# Patient Record
Sex: Female | Born: 1975 | Race: Black or African American | Hispanic: No | Marital: Married | Smoking: Never smoker
Health system: Southern US, Community
[De-identification: ages and names within clinical notes are randomized; demographics above are authoritative.]

## PROBLEM LIST (undated history)

## (undated) DIAGNOSIS — Z87442 Personal history of urinary calculi: Secondary | ICD-10-CM

## (undated) DIAGNOSIS — O26899 Other specified pregnancy related conditions, unspecified trimester: Secondary | ICD-10-CM

## (undated) DIAGNOSIS — D649 Anemia, unspecified: Secondary | ICD-10-CM

## (undated) DIAGNOSIS — R0602 Shortness of breath: Secondary | ICD-10-CM

## (undated) DIAGNOSIS — E785 Hyperlipidemia, unspecified: Secondary | ICD-10-CM

## (undated) DIAGNOSIS — R51 Headache: Secondary | ICD-10-CM

## (undated) DIAGNOSIS — R12 Heartburn: Secondary | ICD-10-CM

## (undated) DIAGNOSIS — G43909 Migraine, unspecified, not intractable, without status migrainosus: Secondary | ICD-10-CM

## (undated) DIAGNOSIS — N390 Urinary tract infection, site not specified: Secondary | ICD-10-CM

## (undated) DIAGNOSIS — R3129 Other microscopic hematuria: Secondary | ICD-10-CM

## (undated) HISTORY — PX: BREAST BIOPSY: SHX20

## (undated) HISTORY — DX: Urinary tract infection, site not specified: N39.0

## (undated) HISTORY — DX: Headache: R51

## (undated) HISTORY — PX: OVARIAN CYST REMOVAL: SHX89

## (undated) HISTORY — DX: Other microscopic hematuria: R31.29

## (undated) HISTORY — PX: BREAST EXCISIONAL BIOPSY: SUR124

## (undated) HISTORY — DX: Migraine, unspecified, not intractable, without status migrainosus: G43.909

## (undated) HISTORY — DX: Hyperlipidemia, unspecified: E78.5

---

## 1998-07-23 HISTORY — PX: SALPINGOOPHORECTOMY: SHX82

## 2011-08-09 ENCOUNTER — Encounter: Payer: Self-pay | Admitting: Family

## 2011-08-09 ENCOUNTER — Ambulatory Visit (INDEPENDENT_AMBULATORY_CARE_PROVIDER_SITE_OTHER): Payer: Commercial Managed Care - PPO | Admitting: Family

## 2011-08-09 DIAGNOSIS — L218 Other seborrheic dermatitis: Secondary | ICD-10-CM

## 2011-08-09 DIAGNOSIS — Z1231 Encounter for screening mammogram for malignant neoplasm of breast: Secondary | ICD-10-CM

## 2011-08-09 DIAGNOSIS — L219 Seborrheic dermatitis, unspecified: Secondary | ICD-10-CM

## 2011-08-09 DIAGNOSIS — Z0000 Encounter for general adult medical examination without abnormal findings: Secondary | ICD-10-CM

## 2011-08-09 DIAGNOSIS — R3129 Other microscopic hematuria: Secondary | ICD-10-CM

## 2011-08-09 LAB — CBC
MCV: 85.5 fl (ref 78.0–100.0)
RBC: 4.4 Mil/uL (ref 3.87–5.11)
RDW: 12.9 % (ref 11.5–14.6)
WBC: 7.6 10*3/uL (ref 4.5–10.5)

## 2011-08-09 LAB — BASIC METABOLIC PANEL
BUN: 13 mg/dL (ref 6–23)
Calcium: 9.2 mg/dL (ref 8.4–10.5)
Creatinine, Ser: 0.7 mg/dL (ref 0.4–1.2)

## 2011-08-09 LAB — TSH: TSH: 1.08 u[IU]/mL (ref 0.35–5.50)

## 2011-08-09 MED ORDER — KETOCONAZOLE 2 % EX SHAM: MEDICATED_SHAMPOO | CUTANEOUS | Status: DC

## 2011-08-09 NOTE — Progress Notes (Signed)
Subjective:    Patient ID: Kristi Rogers, female    DOB: [birth year], 36 y.o.   MRN: [id number]  HPI Comments: C/o intermittent headaches getting progressively worse over the last six months at time of menses. Works as a Development worker, community and having on-the-job stress. C/o nausea and photophobia with headaches. Pain relieved and headaches subsides when lying down in dark room. "Headache is in frontal lobe and nonradiating."    This is a routine physical examination for this healthy  Female. Reviewed all health maintenance protocols including mammography colonoscopy bone density and reviewed appropriate screening labs. Her immunization history was reviewed as well as her current medications and allergies refills of her chronic medications were given and the plan for yearly health maintenance was discussed all orders and referrals were made as appropriate.    Review of Systems  Constitutional: Negative.   HENT: Negative for neck pain and neck stiffness.   Eyes: Negative.   Respiratory: Negative.   Cardiovascular: Negative.   Genitourinary: Negative.   Musculoskeletal: Negative.   Skin: Negative.   Neurological: Positive for headaches. Negative for dizziness, seizures, syncope, facial asymmetry, speech difficulty, weakness, light-headedness and numbness.  Psychiatric/Behavioral: Negative.    Past Medical History  Diagnosis Date  . GERD (gastroesophageal reflux disease)   . Headache   . Hyperlipidemia   . Kidney stones   . Migraines   . UTI (lower urinary tract infection)   . Microscopic hematuria     History   Social History  . Marital Status: Married    Spouse Name: N/A    Number of Children: N/A  . Years of Education: N/A   Occupational History  . Not on file.   Social History Main Topics  . Smoking status: Never Smoker   . Smokeless tobacco: Not on file  . Alcohol Use: Yes     occassionally  . Drug Use: No  . Sexually Active: Not on file   Other Topics Concern  . Not on  file   Social History Narrative  . No narrative on file    Past Surgical History  Procedure Date  . Salpingoophorectomy 2000  . Cesarean section 2008  . Ovarian cyst removal     Family History  Problem Relation Age of Onset  . Cancer Sister     breast  . Arthritis Maternal Grandmother   . Hyperlipidemia Maternal Grandmother   . Heart disease Maternal Grandmother   . Alcohol abuse Maternal Grandfather   . Arthritis Maternal Grandfather   . Hyperlipidemia Maternal Grandfather   . Heart disease Maternal Grandfather   . Kidney disease Maternal Grandfather   . Arthritis Paternal Grandmother   . Arthritis Paternal Grandfather   . Hyperlipidemia Paternal Grandfather     No Known Allergies  No current outpatient prescriptions on file prior to visit.    BP 110/80  Ht 5' 6.5" (1.689 m)  Wt 162 lb (73.483 kg)  BMI 25.76 kg/m2chart     Objective:   Physical Exam  Constitutional: She is oriented to person, place, and time. She appears well-developed and well-nourished.  HENT:  Head: Normocephalic and atraumatic.  Eyes: Pupils are equal, round, and reactive to light.  Neck: No thyromegaly present.  Cardiovascular: Normal rate, regular rhythm, normal heart sounds and intact distal pulses.  Exam reveals no gallop and no friction rub.   No murmur heard. Pulmonary/Chest: Effort normal and breath sounds normal. No respiratory distress. She has no wheezes. She has no rales. She exhibits no tenderness.  Abdominal:  Soft. Bowel sounds are normal. She exhibits no distension. There is no tenderness. There is no rebound and no guarding.  Musculoskeletal: Normal range of motion. She exhibits no edema and no tenderness.  Lymphadenopathy:    She has no cervical adenopathy.  Neurological: She is alert and oriented to person, place, and time. She has normal reflexes.  Skin: Skin is warm and dry.          Assessment & Plan:  Assessment: CPE, Tension headaches,  hematuria-microscopic  Plan: Naproxen take with food, exercise for stress relief. Handgun safety.  Labs: Cbc, TSH, Cmp. Due to family history, mammogram ordered. Call the office if symptoms worsen or persist, recheck as scheduled and PRN.

## 2011-08-09 NOTE — Patient Instructions (Signed)
Breast Self-Exam A self breast exam may help you find changes or problems while they are still small. Do a breast self-exam:  Every month.   One week after your period (menstrual period).   On the first day of each month if you do not have periods anymore.  Look for any:  Change in breast color, size, or shape.   Dimples in your breast.   Changes in your nipples or skin.   Dry skin on your breasts or nipples.   Watery or bloody discharge from your nipples.   Feel for:  Lumps.   Thick, hard places.   Any other changes.  HOME CARE There are 3 ways to do the breast self-exam: In front of a mirror.  Lift your arms over your head and turn side to side.   Put your hands on your hips and lean down, then turn from side to side.   Bend forward and turn from side to side.  In the shower.  With soapy hands, check both breasts. Then check above and below your collarbone and your armpits.   Feel above and below your collarbone down to under your breast, and from the center of your chest to the outer edge of the armpit. Check for any lumps or hard spots.   Using the tips of your middle three fingers check your whole breast by pressing your hand over your breast in a circle or in an up and down motion.  Lying down.  Lie flat on your bed.   Put a small pillow under the breast you are going to check. On that same side, put your hand behind your head.   With your other hand, use the 3 middle fingers to feel the breast.   Move your fingers in a circle around the breast. Press firmly over all parts of the breast to feel for any lumps.  GET HELP RIGHT AWAY IF: You find any changes in your breasts so they can be checked. Document Released: 12/26/2007 Document Revised: 03/21/2011 Document Reviewed: 10/27/2008 Exit[employer] Patient Information 2012 [employer]. Exercise to Stay Healthy Exercise helps you become and stay healthy. EXERCISE IDEAS AND TIPS Choose exercises that:  You  enjoy.   Fit into your day.  You do not need to exercise really hard to be healthy. You can do exercises at a slow or medium level and stay healthy. You can:  Stretch before and after working out.   Try yoga, Pilates, or tai chi.   Lift weights.   Walk fast, swim, jog, run, climb stairs, bicycle, dance, or rollerskate.   Take aerobic classes.  Exercises that burn about 150 calories:  Running 1  miles in 15 minutes.   Playing volleyball for 45 to 60 minutes.   Washing and waxing a car for 45 to 60 minutes.   Playing touch football for 45 minutes.   Walking 1  miles in 35 minutes.   Pushing a stroller 1  miles in 30 minutes.   Playing basketball for 30 minutes.   Raking leaves for 30 minutes.   Bicycling 5 miles in 30 minutes.   Walking 2 miles in 30 minutes.   Dancing for 30 minutes.   Shoveling snow for 15 minutes.   Swimming laps for 20 minutes.   Walking up stairs for 15 minutes.   Bicycling 4 miles in 15 minutes.   Gardening for 30 to 45 minutes.   Jumping rope for 15 minutes.   Washing windows or floors   for 45 to 60 minutes.  Document Released: 08/11/2010 Document Revised: 03/21/2011 Document Reviewed: 08/11/2010 [employer] Patient Information 2012 [employer]. 

## 2011-08-24 ENCOUNTER — Ambulatory Visit
Admission: RE | Admit: 2011-08-24 | Discharge: 2011-08-24 | Disposition: A | Discharge status: Home / Self Care | Payer: Commercial Managed Care - PPO | Source: Ambulatory Visit | Attending: Family | Admitting: Family

## 2011-08-24 DIAGNOSIS — Z0000 Encounter for general adult medical examination without abnormal findings: Secondary | ICD-10-CM

## 2011-08-24 DIAGNOSIS — Z1231 Encounter for screening mammogram for malignant neoplasm of breast: Secondary | ICD-10-CM

## 2011-08-30 ENCOUNTER — Other Ambulatory Visit: Payer: Self-pay | Admitting: Family

## 2011-08-30 DIAGNOSIS — R928 Other abnormal and inconclusive findings on diagnostic imaging of breast: Secondary | ICD-10-CM

## 2011-08-31 ENCOUNTER — Other Ambulatory Visit: Payer: Self-pay | Admitting: Family

## 2011-08-31 ENCOUNTER — Telehealth: Payer: Self-pay | Admitting: *Deleted

## 2011-08-31 DIAGNOSIS — Z0000 Encounter for general adult medical examination without abnormal findings: Secondary | ICD-10-CM

## 2011-08-31 NOTE — Telephone Encounter (Signed)
Urology is asking for pt's UA to be faxed ASAP.

## 2011-08-31 NOTE — Telephone Encounter (Signed)
Kristi Rogers spoke with pt. UA was not done in office but pt has hx of hematuria and asked for a urology referral. Pt is currently being seen at urology

## 2011-09-07 ENCOUNTER — Ambulatory Visit
Admission: RE | Admit: 2011-09-07 | Discharge: 2011-09-07 | Disposition: A | Discharge status: Home / Self Care | Payer: Commercial Managed Care - PPO | Source: Ambulatory Visit | Attending: Family | Admitting: Family

## 2011-09-07 DIAGNOSIS — R928 Other abnormal and inconclusive findings on diagnostic imaging of breast: Secondary | ICD-10-CM

## 2011-09-14 ENCOUNTER — Other Ambulatory Visit: Payer: Commercial Managed Care - PPO

## 2011-09-28 ENCOUNTER — Other Ambulatory Visit (INDEPENDENT_AMBULATORY_CARE_PROVIDER_SITE_OTHER): Payer: Commercial Managed Care - PPO

## 2011-09-28 DIAGNOSIS — Z0000 Encounter for general adult medical examination without abnormal findings: Secondary | ICD-10-CM

## 2011-09-28 LAB — LIPID PANEL
Cholesterol: 203 mg/dL — ABNORMAL HIGH (ref 0–200)
HDL: 61.2 mg/dL (ref >39.00)
Triglycerides: 96 mg/dL (ref 0.0–149.0)
VLDL: 19.2 mg/dL (ref 0.0–40.0)

## 2011-11-07 ENCOUNTER — Emergency Department (HOSPITAL_COMMUNITY)
Admission: EM | Admit: 2011-11-07 | Discharge: 2011-11-08 | Disposition: A | Discharge status: Home / Self Care | Payer: Commercial Managed Care - PPO | Attending: Emergency Medicine | Admitting: Emergency Medicine

## 2011-11-07 ENCOUNTER — Encounter (HOSPITAL_COMMUNITY): Payer: Self-pay | Admitting: Emergency Medicine

## 2011-11-07 ENCOUNTER — Emergency Department (HOSPITAL_COMMUNITY): Payer: Commercial Managed Care - PPO

## 2011-11-07 DIAGNOSIS — R1032 Left lower quadrant pain: Secondary | ICD-10-CM | POA: Insufficient documentation

## 2011-11-07 DIAGNOSIS — N200 Calculus of kidney: Secondary | ICD-10-CM | POA: Insufficient documentation

## 2011-11-07 DIAGNOSIS — E785 Hyperlipidemia, unspecified: Secondary | ICD-10-CM | POA: Insufficient documentation

## 2011-11-07 DIAGNOSIS — N2889 Other specified disorders of kidney and ureter: Secondary | ICD-10-CM

## 2011-11-07 DIAGNOSIS — R1031 Right lower quadrant pain: Secondary | ICD-10-CM | POA: Insufficient documentation

## 2011-11-07 DIAGNOSIS — R110 Nausea: Secondary | ICD-10-CM | POA: Insufficient documentation

## 2011-11-07 DIAGNOSIS — K219 Gastro-esophageal reflux disease without esophagitis: Secondary | ICD-10-CM | POA: Insufficient documentation

## 2011-11-07 LAB — DIFFERENTIAL
Basophils Absolute: 0 10*3/uL (ref 0.0–0.1)
Eosinophils Absolute: 0 10*3/uL (ref 0.0–0.7)
Eosinophils Relative: 0 % (ref 0–5)
Lymphocytes Relative: 12 % (ref 12–46)
Monocytes Absolute: 0.5 10*3/uL (ref 0.1–1.0)

## 2011-11-07 LAB — COMPREHENSIVE METABOLIC PANEL
ALT: 13 U/L (ref 0–35)
AST: 17 U/L (ref 0–37)
CO2: 24 mEq/L (ref 19–32)
Calcium: 9.9 mg/dL (ref 8.4–10.5)
Creatinine, Ser: 0.68 mg/dL (ref 0.50–1.10)
GFR calc Af Amer: >90 mL/min (ref >90)
GFR calc non Af Amer: >90 mL/min (ref >90)
Glucose, Bld: 107 mg/dL — ABNORMAL HIGH (ref 70–99)
Sodium: 136 mEq/L (ref 135–145)
Total Protein: 7.5 g/dL (ref 6.0–8.3)

## 2011-11-07 LAB — URINALYSIS, ROUTINE W REFLEX MICROSCOPIC
Nitrite: NEGATIVE
Specific Gravity, Urine: 1.017 (ref 1.005–1.030)
Urobilinogen, UA: 1 mg/dL (ref 0.0–1.0)
pH: 7.5 (ref 5.0–8.0)

## 2011-11-07 LAB — CBC
HCT: 36 % (ref 36.0–46.0)
MCH: 28.3 pg (ref 26.0–34.0)
MCHC: 34.2 g/dL (ref 30.0–36.0)
MCV: 82.9 fL (ref 78.0–100.0)
Platelets: 266 10*3/uL (ref 150–400)
RDW: 12.9 % (ref 11.5–15.5)
WBC: 13.2 10*3/uL — ABNORMAL HIGH (ref 4.0–10.5)

## 2011-11-07 LAB — PREGNANCY, URINE: Preg Test, Ur: NEGATIVE

## 2011-11-07 LAB — URINE MICROSCOPIC-ADD ON

## 2011-11-07 MED ORDER — IOHEXOL 300 MG/ML  SOLN
80.0000 mL | Freq: Once | INTRAMUSCULAR | Status: AC | PRN
  Administered 2011-11-07: 80 mL via INTRAVENOUS

## 2011-11-07 MED ORDER — CIPROFLOXACIN HCL 500 MG PO TABS: 500.0000 mg | ORAL_TABLET | Freq: Two times a day (BID) | ORAL | Status: AC

## 2011-11-07 NOTE — ED Notes (Signed)
Pt c/o abdominal pain in RLQ and LLQ that radiates to lower back and nausea x1. Pain started in left lower abdomen and now radiates to LLQ.  Pt denies vomiting and diarrhea.  Pt able to tolerate foods and fluids.  Pt has hx of kidneys stones and ovarian torsion.

## 2011-11-07 NOTE — ED Provider Notes (Signed)
History     CSN: [id number]  Arrival date & time 11/07/11  1514   First MD Initiated Contact with Patient 11/07/11 2001      Chief Complaint  Patient presents with  . Abdominal Pain    (Consider location/radiation/quality/duration/timing/severity/associated sxs/prior treatment) HPI Comments: Patient reports she began having LLQ pain yesterday that felt like a "spasm," then noticed some urgency with urination.  The pain became sharp and then began to spread throughout her lower abdomen.  Now has dull ache.  Pain was initially 8/10, now 5/10 with laying flat.  Has associated nausea and increased number of bowel movements today.  Denies fevers, vomiting, dysuria, urinary frequency, abnormal vaginal bleeding or discharge, diarrhea, bloody stool.  Pt is s/p left salpingoophorectomy (for torsion) and right ovarian cystectomy.    Patient is a 36 y.o. female presenting with abdominal pain. The history is provided by the patient.  Abdominal Pain The primary symptoms of the illness include abdominal pain and nausea. The primary symptoms of the illness do not include fever, fatigue, vomiting, diarrhea, hematemesis, hematochezia, dysuria, vaginal discharge or vaginal bleeding.  Additional symptoms associated with the illness include urgency. Symptoms associated with the illness do not include constipation or frequency.    Past Medical History  Diagnosis Date  . GERD (gastroesophageal reflux disease)   . Headache   . Hyperlipidemia   . Kidney stones   . Migraines   . UTI (lower urinary tract infection)   . Microscopic hematuria     Past Surgical History  Procedure Date  . Salpingoophorectomy 2000  . Cesarean section 2008  . Ovarian cyst removal     Family History  Problem Relation Age of Onset  . Cancer Sister     breast  . Arthritis Maternal Grandmother   . Hyperlipidemia Maternal Grandmother   . Heart disease Maternal Grandmother   . Alcohol abuse Maternal Grandfather   .  Arthritis Maternal Grandfather   . Hyperlipidemia Maternal Grandfather   . Heart disease Maternal Grandfather   . Kidney disease Maternal Grandfather   . Arthritis Paternal Grandmother   . Arthritis Paternal Grandfather   . Hyperlipidemia Paternal Grandfather     History  Substance Use Topics  . Smoking status: Never Smoker   . Smokeless tobacco: Not on file  . Alcohol Use: Yes     occassionally    OB History    Grav Para Term Preterm Abortions TAB SAB Ect Mult Living                  Review of Systems  Constitutional: Negative for fever and fatigue.  Gastrointestinal: Positive for nausea and abdominal pain. Negative for vomiting, diarrhea, constipation, blood in stool, hematochezia, anal bleeding, rectal pain and hematemesis.  Genitourinary: Positive for urgency. Negative for dysuria, frequency, vaginal bleeding and vaginal discharge.  All other systems reviewed and are negative.    Allergies  Review of patient's allergies indicates no known allergies.  Home Medications   Current Outpatient Rx  Name Route Sig Dispense Refill  . CETIRIZINE HCL 10 MG PO TABS Oral Take 10 mg by mouth daily.    Marland Kitchen VITAMIN D 1000 UNITS PO TABS Oral Take 1,000 Units by mouth daily.    . OMEGA-3 FATTY ACIDS 1000 MG PO CAPS Oral Take 2 g by mouth daily.    Marland Kitchen OVER THE COUNTER MEDICATION Oral Take 1 tablet by mouth every 6 (six) hours as needed. Tylenol Migraine with caffeine.  For migraines.  BP 109/61  Pulse 80  Temp(Src) 97.8 F (36.6 C) (Oral)  Resp 22  SpO2 99%  LMP 10/24/2011  Physical Exam  Nursing note and vitals reviewed. Constitutional: She is oriented to person, place, and time. She appears well-developed and well-nourished. No distress.  HENT:  Head: Normocephalic and atraumatic.  Neck: Neck supple.  Cardiovascular: Normal rate, regular rhythm and normal heart sounds.   Pulmonary/Chest: Breath sounds normal. No respiratory distress. She has no wheezes. She has no  rales. She exhibits no tenderness.  Abdominal: Soft. Bowel sounds are normal. She exhibits no distension and no mass. There is tenderness in the right lower quadrant. There is no rigidity, no rebound, no guarding and no CVA tenderness.       Patient tender across lower abdomen, greatest on R.  Neurological: She is alert and oriented to person, place, and time.  Skin: She is not diaphoretic.  Psychiatric: She has a normal mood and affect. Her behavior is normal. Judgment and thought content normal.    ED Course  Procedures (including critical care time)  Labs Reviewed  CBC - Abnormal; Notable for the following:    WBC 13.2 (*)    All other components within normal limits  DIFFERENTIAL - Abnormal; Notable for the following:    Neutrophils Relative 84 (*)    Neutro Abs 11.1 (*)    All other components within normal limits  COMPREHENSIVE METABOLIC PANEL - Abnormal; Notable for the following:    Glucose, Bld 107 (*)    All other components within normal limits  URINALYSIS, ROUTINE W REFLEX MICROSCOPIC - Abnormal; Notable for the following:    APPearance CLOUDY (*)    Hgb urine dipstick LARGE (*)    All other components within normal limits  PREGNANCY, URINE  URINE MICROSCOPIC-ADD ON  URINE CULTURE   Ct Abdomen Pelvis W Contrast  11/07/2011  *RADIOLOGY REPORT*  Clinical Data: Right lower quadrant and left lower quadrant abdominal pain, radiating to the lower back.  Nausea.  CT ABDOMEN AND PELVIS WITH CONTRAST  Technique:  Multidetector CT imaging of the abdomen and pelvis was performed following the standard protocol during bolus administration of intravenous contrast.  Contrast: 80mL OMNIPAQUE IOHEXOL 300 MG/ML  SOLN  Comparison: CT of the abdomen and pelvis performed 09/21/2011  Findings: Trace bilateral pleural effusions are noted; the visualized lung bases are otherwise grossly clear.  The liver and spleen are unremarkable in appearance.  The gallbladder is within normal limits.  The  pancreas and adrenal glands are unremarkable.  There is mild diffuse soft tissue stranding noted along the course of the left ureter, with mild wall thickening, raising suspicion for mild left-sided ureteritis.  No significant pyelonephritis is seen.  There is no evidence of hydronephrosis.  No obstructing ureteral stones are identified.  A nonobstructing 3 mm stone is noted at the lower pole of the right kidney.  A 2.0 cm cyst is noted near the upper pole of the right kidney.  The left kidney is grossly unremarkable in appearance.  No significant perinephric stranding is appreciated.  No free fluid is identified.  The small bowel is unremarkable in appearance.  The stomach is within normal limits.  No acute vascular abnormalities are seen.  The appendix is normal in caliber, without evidence for appendicitis.  The colon is unremarkable in appearance.  The bladder is moderately distended and grossly unremarkable in appearance.  The uterus is within normal limits.  The left ovary has been removed; the right ovary  is unremarkable in appearance. No suspicious adnexal masses are seen.  No inguinal lymphadenopathy is seen.  No acute osseous abnormalities are identified.  IMPRESSION:  1.  Mild diffuse soft tissue stranding along the course of the left ureter, with mild wall thickening, raising suspicion for mild left- sided ureteritis. 2.  No evidence of hydronephrosis; no obstructing ureteral stones seen. 3.  Nonobstructing 3 mm stone at the lower pole of the right kidney. 4.  Right renal cyst seen. 5.  Trace bilateral pleural effusion; lung bases otherwise grossly clear.  Original Report Authenticated By: Tonia Ghent, M.D.    11:19 PM I discussed the results with Dr Radford Pax who suggested I discuss the CT with Dr Cherly Hensen who read the CT scan.  Patient found to have left ureteritis but UA appears to be without infection.  Per Dr Cherly Hensen, most ureteritis is infectious, though may be viral or bacterial.  Patient has known  chronic hematuria.  I discussed this with Dr Radford Pax - plan is for urine culture, d/c home with antibiotics and urology follow up.   11:27 PM Discussed results and plan with patient.  Pt declines pain and nausea medications for home.    1. Ureteritis       MDM  Patient with LLQ pain that began yesterday with associated urinary urgency.  Pt found to have left ureteritis.  UA does not appear to be infected, sent for culture.  Given discussion with Dr Cherly Hensen and Dr Radford Pax, pt d/c home with antibiotics, urology follow up.  Patient verbalizes understanding and agrees with plan.          Dillard Cannon [place], [place] 11/07/11 2329

## 2011-11-07 NOTE — Discharge Instructions (Signed)
Please call [facility] tomorrow morning to schedule a follow up appointment.  If you develop uncontrolled pain, nausea and vomiting, fevers greater than 100.4, or difficulty urinating, return to the ER immediately for a recheck.  You may return to the ER at any time for worsening condition or any new symptoms that concern you.

## 2011-11-07 NOTE — ED Notes (Signed)
Urine collected and placed at bedside.  

## 2011-11-09 NOTE — ED Provider Notes (Signed)
Medical screening examination/treatment/procedure(s) were performed by non-physician practitioner and as supervising physician I was immediately available for consultation/collaboration.    Nelia Shi, MD 11/09/11 2036

## 2012-05-08 LAB — OB RESULTS CONSOLE ABO/RH: RH Type: POSITIVE

## 2012-05-08 LAB — OB RESULTS CONSOLE ANTIBODY SCREEN: Antibody Screen: NEGATIVE

## 2012-05-08 LAB — OB RESULTS CONSOLE HEPATITIS B SURFACE ANTIGEN: Hepatitis B Surface Ag: NEGATIVE

## 2012-11-27 ENCOUNTER — Encounter (HOSPITAL_COMMUNITY): Payer: Self-pay | Admitting: Pharmacy Technician

## 2012-12-05 ENCOUNTER — Encounter (HOSPITAL_COMMUNITY): Payer: Self-pay

## 2012-12-08 ENCOUNTER — Encounter (HOSPITAL_COMMUNITY)
Admission: RE | Admit: 2012-12-08 | Discharge: 2012-12-08 | Disposition: A | Discharge status: Home / Self Care | Payer: Commercial Managed Care - PPO | Source: Ambulatory Visit | Attending: Obstetrics & Gynecology | Admitting: Obstetrics & Gynecology

## 2012-12-08 ENCOUNTER — Encounter (HOSPITAL_COMMUNITY): Payer: Self-pay

## 2012-12-08 HISTORY — DX: Shortness of breath: R06.02

## 2012-12-08 HISTORY — DX: Personal history of urinary calculi: Z87.442

## 2012-12-08 HISTORY — DX: Heartburn: R12

## 2012-12-08 HISTORY — DX: Anemia, unspecified: D64.9

## 2012-12-08 HISTORY — DX: Other specified pregnancy related conditions, unspecified trimester: O26.899

## 2012-12-08 LAB — CBC
Platelets: 127 10*3/uL — ABNORMAL LOW (ref 150–400)
RBC: 3.61 MIL/uL — ABNORMAL LOW (ref 3.87–5.11)
RDW: 15.6 % — ABNORMAL HIGH (ref 11.5–15.5)
WBC: 8.3 10*3/uL (ref 4.0–10.5)

## 2012-12-08 LAB — TYPE AND SCREEN
ABO/RH(D): A POS
Antibody Screen: NEGATIVE

## 2012-12-08 LAB — RPR: RPR Ser Ql: NONREACTIVE

## 2012-12-08 NOTE — Pre-Procedure Instructions (Signed)
Dr. Sherron Ales notified of pt's platelets results, 127.

## 2012-12-08 NOTE — Patient Instructions (Signed)
Your procedure is scheduled on:12/10/12  Enter through the Main Entrance at :7am Pick up desk phone and dial [phone] and inform us of your arrival.  Please call [phone] if you have any problems the morning of surgery.  Remember: Do not eat or drink after midnight:Tuesday    DO NOT wear jewelry, eye make-up, lipstick,body lotion, or dark fingernail polish.  If you are to be admitted after surgery, leave suitcase in car until your room has been assigned. Patients discharged on the day of surgery will not be allowed to drive home.

## 2012-12-09 NOTE — H&P (Signed)
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  Antepartum course is complicated by AMA with negative MaterniT21.  GBS negative. Maternal Medical History:  Contractions: Onset was 1 week ago.   Frequency: rare.   Perceived severity is mild.    Fetal activity: Perceived fetal activity is normal.   Last perceived fetal movement was within the past hour.    Prenatal complications: no prenatal complications Prenatal Complications - Diabetes: none.    OB History   Grav Para Term Preterm Abortions TAB SAB Ect Mult Living   2 1 1       1      Past Medical History  Diagnosis Date  . Headache   . Hyperlipidemia   . Migraines   . UTI (lower urinary tract infection)   . Microscopic hematuria   . History of kidney stones   . Heartburn in pregnancy   . Shortness of breath     with pregnancy wt gain  . Anemia    Past Surgical History  Procedure Laterality Date  . Salpingoophorectomy  2000  . Cesarean section  2008  . Ovarian cyst removal     Family History: family history includes Alcohol abuse in her maternal grandfather; Arthritis in her maternal grandfather, maternal grandmother, paternal grandfather, and paternal grandmother; Cancer in her sister; Heart disease in her maternal grandfather and maternal grandmother; Hyperlipidemia in her maternal grandfather, maternal grandmother, and paternal grandfather; and Kidney disease in her maternal grandfather. Social History:  reports that she has never smoked. She does not have any smokeless tobacco history on file. She reports that  drinks alcohol. She reports that she does not use illicit drugs.   Prenatal Transfer Tool  Maternal Diabetes: No Genetic Screening: Normal Maternal Ultrasounds/Referrals: Normal Fetal Ultrasounds or other Referrals:  None Maternal Substance Abuse:  No Significant Maternal Medications:  None Significant Maternal Lab Results:  None Other Comments:  None  ROS    Last menstrual  period 03/12/2012. Maternal Exam:  Uterine Assessment: Contraction strength is mild.  Contraction frequency is rare.   Abdomen: Patient reports no abdominal tenderness. Surgical scars: low transverse.   Fundal height is c/w dates.   Estimated fetal weight is 7#6.   Fetal presentation: vertex     Physical Exam  Constitutional: She is oriented to person, place, and time. She appears well-developed and well-nourished.  Cardiovascular: Normal rate and regular rhythm.   Respiratory: Effort normal and breath sounds normal.  GI: Soft. There is no rebound and no guarding.  Neurological: She is alert and oriented to person, place, and time.  Skin: Skin is warm and dry.  Psychiatric: She has a normal mood and affect. Her behavior is normal.    Prenatal labs: ABO, Rh: --/--/A POS (05/19 1059) Antibody: NEG (05/19 1059) Rubella: Immune (10/17 0000) RPR: NON REACTIVE (05/19 1059)  HBsAg: Negative (10/17 0000)  HIV: Non-reactive (10/17 0000)  GBS:     Assessment/Plan: 36yo G2P1001 at 39 weeks with h/o prior C/S, desires rpt -Plan for Rpt C/S  Kristi Rogers 12/09/2012, 8:35 PM

## 2012-12-10 ENCOUNTER — Encounter (HOSPITAL_COMMUNITY): Payer: Self-pay | Admitting: *Deleted

## 2012-12-10 ENCOUNTER — Encounter (HOSPITAL_COMMUNITY): Payer: Self-pay | Admitting: Anesthesiology

## 2012-12-10 ENCOUNTER — Inpatient Hospital Stay (HOSPITAL_COMMUNITY): Payer: Commercial Managed Care - PPO | Admitting: Anesthesiology

## 2012-12-10 ENCOUNTER — Inpatient Hospital Stay (HOSPITAL_COMMUNITY)
Admission: AD | Admit: 2012-12-10 | Discharge: 2012-12-12 | DRG: 766 | Disposition: A | Discharge status: Home / Self Care | Payer: Commercial Managed Care - PPO | Source: Ambulatory Visit | Attending: Obstetrics and Gynecology | Admitting: Obstetrics and Gynecology

## 2012-12-10 ENCOUNTER — Encounter (HOSPITAL_COMMUNITY)
Admission: AD | Disposition: A | Discharge status: Home / Self Care | Payer: Self-pay | Source: Ambulatory Visit | Attending: Obstetrics and Gynecology

## 2012-12-10 DIAGNOSIS — O09529 Supervision of elderly multigravida, unspecified trimester: Secondary | ICD-10-CM | POA: Diagnosis present

## 2012-12-10 DIAGNOSIS — O34219 Maternal care for unspecified type scar from previous cesarean delivery: Principal | ICD-10-CM | POA: Diagnosis present

## 2012-12-10 LAB — CBC
HCT: 31.6 % — ABNORMAL LOW (ref 36.0–46.0)
MCV: 84.9 fL (ref 78.0–100.0)
Platelets: 121 10*3/uL — ABNORMAL LOW (ref 150–400)
RBC: 3.72 MIL/uL — ABNORMAL LOW (ref 3.87–5.11)
WBC: 8.6 10*3/uL (ref 4.0–10.5)

## 2012-12-10 SURGERY — Surgical Case
Anesthesia: Spinal | Site: Abdomen | Wound class: Clean Contaminated

## 2012-12-10 MED ORDER — LACTATED RINGERS IV SOLN
INTRAVENOUS | Status: DC
  Administered 2012-12-10 – 2012-12-11 (×2): via INTRAVENOUS

## 2012-12-10 MED ORDER — LACTATED RINGERS IV SOLN
INTRAVENOUS | Status: DC
  Administered 2012-12-10 (×3): via INTRAVENOUS

## 2012-12-10 MED ORDER — PHENYLEPHRINE HCL 10 MG/ML IJ SOLN
INTRAMUSCULAR | Status: DC | PRN
  Administered 2012-12-10: 40 ug via INTRAVENOUS
  Administered 2012-12-10: 80 ug via INTRAVENOUS
  Administered 2012-12-10: 40 ug via INTRAVENOUS
  Administered 2012-12-10 (×3): 80 ug via INTRAVENOUS

## 2012-12-10 MED ORDER — KETOROLAC TROMETHAMINE 30 MG/ML IJ SOLN
30.0000 mg | Freq: Four times a day (QID) | INTRAMUSCULAR | Status: AC | PRN
  Administered 2012-12-10 (×2): 30 mg via INTRAVENOUS
  Filled 2012-12-10 (×2): qty 1

## 2012-12-10 MED ORDER — SODIUM CHLORIDE 0.9 % IJ SOLN: 3.0000 mL | INTRAMUSCULAR | Status: DC | PRN

## 2012-12-10 MED ORDER — BUPIVACAINE IN DEXTROSE 0.75-8.25 % IT SOLN
INTRATHECAL | Status: DC | PRN
  Administered 2012-12-10: 1.8 mL via INTRATHECAL

## 2012-12-10 MED ORDER — OXYTOCIN 10 UNIT/ML IJ SOLN
INTRAMUSCULAR | Status: AC
  Filled 2012-12-10: qty 4

## 2012-12-10 MED ORDER — DIPHENHYDRAMINE HCL 50 MG/ML IJ SOLN: 25.0000 mg | INTRAMUSCULAR | Status: DC | PRN

## 2012-12-10 MED ORDER — ONDANSETRON HCL 4 MG/2ML IJ SOLN
INTRAMUSCULAR | Status: DC | PRN
  Administered 2012-12-10: 4 mg via INTRAVENOUS

## 2012-12-10 MED ORDER — NALOXONE HCL 0.4 MG/ML IJ SOLN: 0.4000 mg | INTRAMUSCULAR | Status: DC | PRN

## 2012-12-10 MED ORDER — SCOPOLAMINE 1 MG/3DAYS TD PT72
MEDICATED_PATCH | TRANSDERMAL | Status: AC
  Administered 2012-12-10: 1.5 mg via TRANSDERMAL
  Filled 2012-12-10: qty 1

## 2012-12-10 MED ORDER — DEXTROSE IN LACTATED RINGERS 5 % IV SOLN: INTRAVENOUS | Status: DC

## 2012-12-10 MED ORDER — OXYTOCIN 10 UNIT/ML IJ SOLN
40.0000 [IU] | INTRAVENOUS | Status: DC | PRN
  Administered 2012-12-10: 40 [IU] via INTRAVENOUS

## 2012-12-10 MED ORDER — OXYTOCIN 40 UNITS IN LACTATED RINGERS INFUSION - SIMPLE MED: 62.5000 mL/h | INTRAVENOUS | Status: AC

## 2012-12-10 MED ORDER — IBUPROFEN 600 MG PO TABS
600.0000 mg | ORAL_TABLET | Freq: Four times a day (QID) | ORAL | Status: DC
  Administered 2012-12-10 – 2012-12-12 (×6): 600 mg via ORAL
  Filled 2012-12-10 (×6): qty 1

## 2012-12-10 MED ORDER — MENTHOL 3 MG MT LOZG: 1.0000 | LOZENGE | OROMUCOSAL | Status: DC | PRN

## 2012-12-10 MED ORDER — SENNOSIDES-DOCUSATE SODIUM 8.6-50 MG PO TABS
2.0000 | ORAL_TABLET | Freq: Every day | ORAL | Status: DC
  Administered 2012-12-10 – 2012-12-11 (×2): 2 via ORAL

## 2012-12-10 MED ORDER — PHENYLEPHRINE 40 MCG/ML (10ML) SYRINGE FOR IV PUSH (FOR BLOOD PRESSURE SUPPORT)
PREFILLED_SYRINGE | INTRAVENOUS | Status: AC
  Filled 2012-12-10: qty 10

## 2012-12-10 MED ORDER — SIMETHICONE 80 MG PO CHEW
80.0000 mg | CHEWABLE_TABLET | Freq: Three times a day (TID) | ORAL | Status: DC
Start: 2012-12-10 — End: 2012-12-12
  Administered 2012-12-10 – 2012-12-12 (×5): 80 mg via ORAL

## 2012-12-10 MED ORDER — NALBUPHINE SYRINGE 5 MG/0.5 ML
5.0000 mg | INJECTION | INTRAMUSCULAR | Status: DC | PRN
  Filled 2012-12-10: qty 1

## 2012-12-10 MED ORDER — FERROUS SULFATE 325 (65 FE) MG PO TABS
325.0000 mg | ORAL_TABLET | Freq: Every day | ORAL | Status: DC
  Administered 2012-12-11 – 2012-12-12 (×2): 325 mg via ORAL
  Filled 2012-12-10 (×2): qty 1

## 2012-12-10 MED ORDER — OXYCODONE-ACETAMINOPHEN 5-325 MG PO TABS: 1.0000 | ORAL_TABLET | ORAL | Status: DC | PRN

## 2012-12-10 MED ORDER — PRENATAL MULTIVITAMIN CH
1.0000 | ORAL_TABLET | Freq: Every day | ORAL | Status: DC
  Administered 2012-12-11: 1 via ORAL

## 2012-12-10 MED ORDER — ONDANSETRON HCL 4 MG/2ML IJ SOLN: 4.0000 mg | INTRAMUSCULAR | Status: DC | PRN

## 2012-12-10 MED ORDER — WITCH HAZEL-GLYCERIN EX PADS: 1.0000 "application " | MEDICATED_PAD | CUTANEOUS | Status: DC | PRN

## 2012-12-10 MED ORDER — NALOXONE HCL 1 MG/ML IJ SOLN
1.0000 ug/kg/h | INTRAMUSCULAR | Status: DC | PRN
  Filled 2012-12-10: qty 2

## 2012-12-10 MED ORDER — LANOLIN HYDROUS EX OINT: 1.0000 "application " | TOPICAL_OINTMENT | CUTANEOUS | Status: DC | PRN

## 2012-12-10 MED ORDER — ONDANSETRON HCL 4 MG/2ML IJ SOLN: 4.0000 mg | Freq: Three times a day (TID) | INTRAMUSCULAR | Status: DC | PRN

## 2012-12-10 MED ORDER — CEFAZOLIN SODIUM-DEXTROSE 2-3 GM-% IV SOLR
INTRAVENOUS | Status: AC
  Filled 2012-12-10: qty 50

## 2012-12-10 MED ORDER — LACTATED RINGERS IV SOLN
INTRAVENOUS | Status: DC | PRN
  Administered 2012-12-10: 09:00:00 via INTRAVENOUS

## 2012-12-10 MED ORDER — DIPHENHYDRAMINE HCL 25 MG PO CAPS: 25.0000 mg | ORAL_CAPSULE | Freq: Four times a day (QID) | ORAL | Status: DC | PRN

## 2012-12-10 MED ORDER — FENTANYL CITRATE 0.05 MG/ML IJ SOLN
INTRAMUSCULAR | Status: AC
  Filled 2012-12-10: qty 2

## 2012-12-10 MED ORDER — SCOPOLAMINE 1 MG/3DAYS TD PT72: 1.0000 | MEDICATED_PATCH | Freq: Once | TRANSDERMAL | Status: DC

## 2012-12-10 MED ORDER — ZOLPIDEM TARTRATE 5 MG PO TABS: 5.0000 mg | ORAL_TABLET | Freq: Every evening | ORAL | Status: DC | PRN

## 2012-12-10 MED ORDER — PHENYLEPHRINE 40 MCG/ML (10ML) SYRINGE FOR IV PUSH (FOR BLOOD PRESSURE SUPPORT)
PREFILLED_SYRINGE | INTRAVENOUS | Status: AC
  Filled 2012-12-10: qty 5

## 2012-12-10 MED ORDER — CEFAZOLIN SODIUM-DEXTROSE 2-3 GM-% IV SOLR
2.0000 g | INTRAVENOUS | Status: AC
  Administered 2012-12-10: 2 g via INTRAVENOUS

## 2012-12-10 MED ORDER — ACETAMINOPHEN 10 MG/ML IV SOLN
1000.0000 mg | Freq: Four times a day (QID) | INTRAVENOUS | Status: AC | PRN
  Filled 2012-12-10: qty 100

## 2012-12-10 MED ORDER — DIBUCAINE 1 % RE OINT: 1.0000 "application " | TOPICAL_OINTMENT | RECTAL | Status: DC | PRN

## 2012-12-10 MED ORDER — NALBUPHINE HCL 10 MG/ML IJ SOLN: 5.0000 mg | INTRAMUSCULAR | Status: DC | PRN

## 2012-12-10 MED ORDER — NALOXONE HCL 1 MG/ML IJ SOLN: 1.0000 ug/kg/h | INTRAVENOUS | Status: DC | PRN

## 2012-12-10 MED ORDER — EPHEDRINE SULFATE 50 MG/ML IJ SOLN
INTRAMUSCULAR | Status: DC | PRN
  Administered 2012-12-10: 10 mg via INTRAVENOUS
  Administered 2012-12-10 (×2): 5 mg via INTRAVENOUS
  Administered 2012-12-10: 10 mg via INTRAVENOUS

## 2012-12-10 MED ORDER — TETANUS-DIPHTH-ACELL PERTUSSIS 5-2.5-18.5 LF-MCG/0.5 IM SUSP
0.5000 mL | Freq: Once | INTRAMUSCULAR | Status: DC
  Filled 2012-12-10: qty 0.5

## 2012-12-10 MED ORDER — MEPERIDINE HCL 25 MG/ML IJ SOLN: 6.2500 mg | INTRAMUSCULAR | Status: DC | PRN

## 2012-12-10 MED ORDER — EPHEDRINE 5 MG/ML INJ
INTRAVENOUS | Status: AC
  Filled 2012-12-10: qty 10

## 2012-12-10 MED ORDER — METOCLOPRAMIDE HCL 5 MG/ML IJ SOLN: 10.0000 mg | Freq: Three times a day (TID) | INTRAMUSCULAR | Status: DC | PRN

## 2012-12-10 MED ORDER — DIPHENHYDRAMINE HCL 50 MG/ML IJ SOLN: 12.5000 mg | INTRAMUSCULAR | Status: DC | PRN

## 2012-12-10 MED ORDER — SCOPOLAMINE 1 MG/3DAYS TD PT72
1.0000 | MEDICATED_PATCH | TRANSDERMAL | Status: DC
Start: 2012-12-10 — End: 2012-12-10

## 2012-12-10 MED ORDER — FENTANYL CITRATE 0.05 MG/ML IJ SOLN: 25.0000 ug | INTRAMUSCULAR | Status: DC | PRN

## 2012-12-10 MED ORDER — ONDANSETRON HCL 4 MG/2ML IJ SOLN
INTRAMUSCULAR | Status: AC
  Filled 2012-12-10: qty 2

## 2012-12-10 MED ORDER — METOCLOPRAMIDE HCL 5 MG/ML IJ SOLN: 10.0000 mg | Freq: Once | INTRAMUSCULAR | Status: DC | PRN

## 2012-12-10 MED ORDER — SIMETHICONE 80 MG PO CHEW
80.0000 mg | CHEWABLE_TABLET | ORAL | Status: DC | PRN
  Administered 2012-12-11: 80 mg via ORAL

## 2012-12-10 MED ORDER — DIPHENHYDRAMINE HCL 25 MG PO CAPS
25.0000 mg | ORAL_CAPSULE | ORAL | Status: DC | PRN
  Filled 2012-12-10: qty 1

## 2012-12-10 MED ORDER — DIPHENHYDRAMINE HCL 25 MG PO CAPS: 25.0000 mg | ORAL_CAPSULE | ORAL | Status: DC | PRN

## 2012-12-10 MED ORDER — KETOROLAC TROMETHAMINE 30 MG/ML IJ SOLN: 30.0000 mg | Freq: Four times a day (QID) | INTRAMUSCULAR | Status: AC | PRN

## 2012-12-10 MED ORDER — MORPHINE SULFATE 0.5 MG/ML IJ SOLN
INTRAMUSCULAR | Status: AC
  Filled 2012-12-10: qty 10

## 2012-12-10 MED ORDER — FENTANYL CITRATE 0.05 MG/ML IJ SOLN
INTRAMUSCULAR | Status: DC | PRN
  Administered 2012-12-10: 25 ug via INTRATHECAL

## 2012-12-10 MED ORDER — MORPHINE SULFATE (PF) 0.5 MG/ML IJ SOLN
INTRAMUSCULAR | Status: DC | PRN
  Administered 2012-12-10: .15 mg via INTRATHECAL

## 2012-12-10 MED ORDER — ONDANSETRON HCL 4 MG PO TABS: 4.0000 mg | ORAL_TABLET | ORAL | Status: DC | PRN

## 2012-12-10 SURGICAL SUPPLY — 31 items
CLOTH BEACON ORANGE TIMEOUT ST (SAFETY) ×2 IMPLANT
DERMABOND ADVANCED (GAUZE/BANDAGES/DRESSINGS)
DERMABOND ADVANCED .7 DNX12 (GAUZE/BANDAGES/DRESSINGS) IMPLANT
DRAPE LG THREE QUARTER DISP (DRAPES) ×2 IMPLANT
DRESSING TELFA 8X3 (GAUZE/BANDAGES/DRESSINGS) ×2 IMPLANT
DRSG OPSITE POSTOP 4X10 (GAUZE/BANDAGES/DRESSINGS) ×2 IMPLANT
DURAPREP 26ML APPLICATOR (WOUND CARE) ×2 IMPLANT
ELECT REM PT RETURN 9FT ADLT (ELECTROSURGICAL) ×2
ELECTRODE REM PT RTRN 9FT ADLT (ELECTROSURGICAL) ×1 IMPLANT
EXTRACTOR VACUUM M CUP 4 TUBE (SUCTIONS) IMPLANT
GLOVE BIO SURGEON STRL SZ 6 (GLOVE) ×2 IMPLANT
GLOVE BIOGEL PI IND STRL 6 (GLOVE) ×2 IMPLANT
GLOVE BIOGEL PI INDICATOR 6 (GLOVE) ×2
GOWN STRL REIN XL XLG (GOWN DISPOSABLE) ×4 IMPLANT
KIT ABG SYR 3ML LUER SLIP (SYRINGE) ×2 IMPLANT
NEEDLE HYPO 25X5/8 SAFETYGLIDE (NEEDLE) ×2 IMPLANT
NS IRRIG 1000ML POUR BTL (IV SOLUTION) ×2 IMPLANT
PACK C SECTION WH (CUSTOM PROCEDURE TRAY) ×2 IMPLANT
PAD ABD 7.5X8 STRL (GAUZE/BANDAGES/DRESSINGS) ×2 IMPLANT
PAD OB MATERNITY 4.3X12.25 (PERSONAL CARE ITEMS) ×2 IMPLANT
SPONGE GAUZE 4X4 12PLY (GAUZE/BANDAGES/DRESSINGS) ×4 IMPLANT
STAPLER VISISTAT 35W (STAPLE) ×2 IMPLANT
SUT CHROMIC 0 CTX 36 (SUTURE) ×6 IMPLANT
SUT MON AB 2-0 CT1 27 (SUTURE) ×2 IMPLANT
SUT PDS AB 0 CT1 27 (SUTURE) ×2 IMPLANT
SUT PLAIN 0 NONE (SUTURE) IMPLANT
SUT VIC AB 0 CT1 36 (SUTURE) IMPLANT
SUT VIC AB 4-0 KS 27 (SUTURE) ×2 IMPLANT
TOWEL OR 17X24 6PK STRL BLUE (TOWEL DISPOSABLE) ×6 IMPLANT
TRAY FOLEY CATH 14FR (SET/KITS/TRAYS/PACK) ×2 IMPLANT
WATER STERILE IRR 1000ML POUR (IV SOLUTION) ×2 IMPLANT

## 2012-12-10 NOTE — Progress Notes (Signed)
No change to H&P. 

## 2012-12-10 NOTE — Anesthesia Postprocedure Evaluation (Signed)
Anesthesia Post Note  Patient: Kristi Isle, MD  Procedure(s) Performed: Procedure(s) (LRB): REPEAT CESAREAN SECTION (N/A)  Anesthesia type: Spinal  Patient location: Mother/Baby  Post pain: Pain level controlled  Post assessment: Post-op Vital signs reviewed  Last Vitals:  Filed Vitals:   12/10/12 1430  BP: 109/65  Pulse: 83  Temp: 36.7 C  Resp: 16    Post vital signs: Reviewed  Level of consciousness: awake  Complications: No apparent anesthesia complications

## 2012-12-10 NOTE — Op Note (Signed)
Glennie Isle, MD PROCEDURE DATE: 12/10/2012  PREOPERATIVE DIAGNOSIS: Intrauterine pregnancy at  [redacted]w[redacted]d weeks gestation with history of prior C/S, desires repeat  POSTOPERATIVE DIAGNOSIS: The same  PROCEDURE:    Low Transverse Cesarean Section  SURGEON:  Dr. 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 [id number] at [redacted]w[redacted]d scheduled for cesarean section secondary to history of prior C/S, desire for repeat.  The risks of cesarean section discussed with the patient included but were not limited to: bleeding which may require transfusion or reoperation; infection which may require antibiotics; injury to bowel, bladder, ureters or other surrounding organs; injury to the fetus; need for additional procedures including hysterectomy in the event of a life-threatening hemorrhage; placental abnormalities wth subsequent pregnancies, incisional problems, thromboembolic phenomenon and other postoperative/anesthesia complications. The patient concurred with the proposed plan, giving informed written consent for the procedure.    FINDINGS:  Viable female infant in cephalic presentation, APGAR 8,8:  Weight pending  Clear amniotic fluid.  Intact placenta, three vessel cord.  Grossly normal uterus, right ovary and fallopian tube; surgically absent left ovary and fallopian tube. .   ANESTHESIA:   Spinal ESTIMATED BLOOD LOSS: 800 ml SPECIMENS: Placenta sent to L&D COMPLICATIONS: None immediate  PROCEDURE IN DETAIL:  The patient received intravenous antibiotics and had sequential compression devices applied to her lower extremities while in the preoperative area.  She was then taken to the operating room where spinal anesthesia was administered and was found to be adequate. She was then placed in a dorsal supine position with a leftward tilt, and prepped and draped in a sterile manner.  A foley catheter was placed into her bladder and attached to constant gravity.  After an adequate timeout was  performed, a Pfannenstiel skin incision was made with scalpel and carried through to the underlying layer of fascia. The fascia was incised in the midline and this incision was extended bilaterally using the Mayo scissors. Kocher clamps were applied to the superior aspect of the fascial incision and the underlying rectus muscles were dissected off bluntly. A similar process was carried out on the inferior aspect of the facial incision. The rectus muscles were separated in the midline bluntly and the peritoneum was entered bluntly.   A transverse hysterotomy was made with a scalpel and extended bilaterally bluntly. The bladder blade was then removed. The infant was successfully delivered, and cord was clamped and cut and infant was handed over to awaiting neonatology team. Uterine massage was then administered and the placenta delivered intact with three-vessel cord and handed off for cord blood donation. The uterus was cleared of clot and debris.  The hysterotomy was closed with #1 Chromicl.  A second imbricating suture of #1 Chromic was used to reinforce the incision and aid in hemostasis.  The peritoneum and rectus muscles were noted to be hemostatic.  The fascia was closed with PDS in a running fashion with good restoration of anatomy.  The subcutaneus tissue was copiously irrigated.  The skin was closed with staples.  Pt tolerated the procedure will.  All counts were correct x2.  Pt went to the recovery room in stable condition.

## 2012-12-10 NOTE — Transfer of Care (Signed)
Immediate Anesthesia Transfer of Care Note  Patient: Kristi Isle, MD  Procedure(s) Performed: Procedure(s) with comments: REPEAT CESAREAN SECTION (N/A) - Repeat edc 12/17/12  Patient Location: PACU  Anesthesia Type:Spinal  Level of Consciousness: awake, alert  and oriented  Airway & Oxygen Therapy: Patient Spontanous Breathing  Post-op Assessment: Report given to PACU RN and Post -op Vital signs reviewed and stable  Post vital signs: stable  Complications: No apparent anesthesia complications

## 2012-12-10 NOTE — Anesthesia Procedure Notes (Signed)
Spinal  Patient location during procedure: OR Start time: 12/10/2012 8:30 AM Staffing Anesthesiologist: Jaleiah Asay A. Performed by: anesthesiologist  Preanesthetic Checklist Completed: patient identified, site marked, surgical consent, pre-op evaluation, timeout performed, IV checked, risks and benefits discussed and monitors and equipment checked Spinal Block Patient position: sitting Prep: site prepped and draped and DuraPrep Patient monitoring: heart rate, cardiac monitor, continuous pulse ox and blood pressure Approach: midline Location: L3-4 Injection technique: single-shot Needle Needle type: Sprotte  Needle gauge: 24 G Needle length: 9 cm Needle insertion depth: 6 cm Assessment Sensory level: T4 Additional Notes Patient tolerated procedure well. Adequate sensory level.

## 2012-12-10 NOTE — Anesthesia Postprocedure Evaluation (Signed)
  Anesthesia Post-op Note  Patient: Kristi Isle, MD  Procedure(s) Performed: Procedure(s) with comments: REPEAT CESAREAN SECTION (N/A) - Repeat edc 12/17/12   Patient is awake, responsive, moving her legs, and has signs of resolution of her numbness. Pain and nausea are reasonably well controlled. Vital signs are stable and clinically acceptable. Oxygen saturation is clinically acceptable. There are no apparent anesthetic complications at this time. Patient is ready for discharge.

## 2012-12-10 NOTE — Anesthesia Preprocedure Evaluation (Addendum)
Anesthesia Evaluation  Patient identified by MRN, date of birth, ID band Patient awake    Reviewed: Allergy & Precautions, H&P , NPO status , Patient's Chart, lab work & pertinent test results  Airway Mallampati: II      Dental no notable dental hx. (+) Teeth Intact and Caps   Pulmonary neg pulmonary ROS,  breath sounds clear to auscultation  Pulmonary exam normal       Cardiovascular Exercise Tolerance: Good negative cardio ROS  Rhythm:regular Rate:Normal     Neuro/Psych  Headaches, negative neurological ROS  negative psych ROS   GI/Hepatic negative GI ROS, Neg liver ROS,   Endo/Other  negative endocrine ROS  Renal/GU negative Renal ROSRenal Calculus  negative genitourinary   Musculoskeletal negative musculoskeletal ROS (+)   Abdominal Normal abdominal exam  (+)   Peds  Hematology negative hematology ROS (+) anemia ,   Anesthesia Other Findings Headache     Hyperlipidemia        Migraines     UTI (lower urinary tract infection)        Microscopic hematuria     History of kidney stones        Heartburn in pregnancy     Shortness of breath   with pregnancy wt gain    Anemia        Reproductive/Obstetrics (+) Pregnancy                          Anesthesia Physical Anesthesia Plan  ASA: II  Anesthesia Plan: Spinal   Post-op Pain Management:    Induction:   Airway Management Planned:   Additional Equipment:   Intra-op Plan:   Post-operative Plan:   Informed Consent: I have reviewed the patients History and Physical, chart, labs and discussed the procedure including the risks, benefits and alternatives for the proposed anesthesia with the patient or authorized representative who has indicated his/her understanding and acceptance.     Plan Discussed with: Anesthesiologist, CRNA and Surgeon  Anesthesia Plan Comments:         Anesthesia Quick Evaluation

## 2012-12-11 ENCOUNTER — Encounter (HOSPITAL_COMMUNITY): Payer: Self-pay | Admitting: Obstetrics & Gynecology

## 2012-12-11 LAB — CBC
HCT: 27 % — ABNORMAL LOW (ref 36.0–46.0)
Hemoglobin: 9.2 g/dL — ABNORMAL LOW (ref 12.0–15.0)
MCHC: 34.1 g/dL (ref 30.0–36.0)

## 2012-12-11 NOTE — Progress Notes (Signed)
Subjective: Postpartum Day 1: Cesarean Delivery Patient reports tolerating PO, + flatus and no problems voiding.    Objective: Vital signs in last 24 hours: Temp:  [97.3 F (36.3 C)-98.3 F (36.8 C)] 98.3 F (36.8 C) (05/22 0454) Pulse Rate:  [66-94] 66 (05/22 0454) Resp:  [16-18] 16 (05/22 0454) BP: (98-118)/(50-79) 105/67 mmHg (05/22 0454) SpO2:  [96 %-100 %] 99 % (05/22 0454) Weight:  [88.451 kg (195 lb)] 88.451 kg (195 lb) (05/21 1019)  Physical Exam:  General: alert, cooperative, appears stated age and no distress Lochia: appropriate Uterine Fundus: firm Incision: healing well DVT Evaluation: No evidence of DVT seen on physical exam.   Recent Labs  12/10/12 0710 12/11/12 0610  HGB 10.5* 9.2*  HCT 31.6* 27.0*    Assessment/Plan: Status post Cesarean section. Doing well postoperatively.  Recheck Plts and Hgb in am.  Kristi Rogers C 12/11/2012, 9:19 AM

## 2012-12-12 LAB — CBC
HCT: 27.5 % — ABNORMAL LOW (ref 36.0–46.0)
Hemoglobin: 9.1 g/dL — ABNORMAL LOW (ref 12.0–15.0)
MCH: 28.3 pg (ref 26.0–34.0)
MCHC: 33.1 g/dL (ref 30.0–36.0)
RBC: 3.21 MIL/uL — ABNORMAL LOW (ref 3.87–5.11)

## 2012-12-12 MED ORDER — FERROUS SULFATE 325 (65 FE) MG PO TABS: 325.0000 mg | ORAL_TABLET | Freq: Every day | ORAL | Status: DC

## 2012-12-12 MED ORDER — IBUPROFEN 600 MG PO TABS: 600.0000 mg | ORAL_TABLET | Freq: Four times a day (QID) | ORAL | Status: DC

## 2012-12-12 NOTE — Discharge Summary (Signed)
Obstetric Discharge Summary Reason for Admission: cesarean section Prenatal Procedures: ultrasound Intrapartum Procedures: cesarean: low cervical, transverse Postpartum Procedures: none Complications-Operative and Postpartum: none Hemoglobin  Date Value Range Status  12/12/2012 9.1* 12.0 - 15.0 g/dL Final     HCT  Date Value Range Status  12/12/2012 27.5* 36.0 - 46.0 % Final    Physical Exam:  General: alert and cooperative Lochia: appropriate Uterine Fundus: firm Incision: honeycomb dressing CDI DVT Evaluation: No evidence of DVT seen on physical exam. Negative Homan's sign. No cords or calf tenderness. No significant calf/ankle edema.  Discharge Diagnoses: Term Pregnancy-delivered  Discharge Information: Date: 12/12/2012 Activity: pelvic rest Diet: routine Medications: PNV, Ibuprofen and Iron Condition: stable Instructions: refer to practice specific booklet Discharge to: home   Newborn Data: Live born female  Birth Weight: 8 lb 12.7 oz (3990 g) APGAR: 8, 8  Home with mother.  Marigny Borre G 12/12/2012, 8:13 AM

## 2014-05-24 ENCOUNTER — Encounter (HOSPITAL_COMMUNITY): Payer: Self-pay | Admitting: Obstetrics & Gynecology

## 2014-06-25 ENCOUNTER — Other Ambulatory Visit: Payer: Self-pay | Admitting: Obstetrics and Gynecology

## 2014-06-28 LAB — CYTOLOGY - PAP

## 2015-09-09 LAB — HM MAMMOGRAPHY

## 2015-09-09 LAB — LAB REPORT - SCANNED
A1c: 5.5
TSH: 0.95 (ref 0.41–5.90)

## 2015-09-12 LAB — HM PAP SMEAR

## 2016-07-23 HISTORY — PX: BREAST BIOPSY: SHX20

## 2017-03-08 LAB — LAB REPORT - SCANNED
A1c: 5.2
Free T4: 1.4 ng/dL
TSH: 0.83 (ref 0.41–5.90)

## 2017-03-08 LAB — HM MAMMOGRAPHY

## 2017-03-11 LAB — HM PAP SMEAR

## 2017-03-12 ENCOUNTER — Other Ambulatory Visit: Payer: Self-pay | Admitting: Obstetrics and Gynecology

## 2017-03-12 DIAGNOSIS — R928 Other abnormal and inconclusive findings on diagnostic imaging of breast: Secondary | ICD-10-CM

## 2017-03-21 ENCOUNTER — Other Ambulatory Visit: Payer: Self-pay | Admitting: Obstetrics and Gynecology

## 2017-03-21 ENCOUNTER — Ambulatory Visit
Admission: RE | Admit: 2017-03-21 | Discharge: 2017-03-21 | Disposition: A | Discharge status: Home / Self Care | Payer: PRIVATE HEALTH INSURANCE | Source: Ambulatory Visit | Attending: Obstetrics and Gynecology | Admitting: Obstetrics and Gynecology

## 2017-03-21 DIAGNOSIS — R928 Other abnormal and inconclusive findings on diagnostic imaging of breast: Secondary | ICD-10-CM

## 2017-03-21 DIAGNOSIS — R921 Mammographic calcification found on diagnostic imaging of breast: Secondary | ICD-10-CM

## 2017-03-22 ENCOUNTER — Other Ambulatory Visit: Payer: Self-pay | Admitting: Obstetrics and Gynecology

## 2017-03-22 ENCOUNTER — Ambulatory Visit
Admission: RE | Admit: 2017-03-22 | Discharge: 2017-03-22 | Disposition: A | Discharge status: Home / Self Care | Payer: PRIVATE HEALTH INSURANCE | Source: Ambulatory Visit | Attending: Obstetrics and Gynecology | Admitting: Obstetrics and Gynecology

## 2017-03-22 DIAGNOSIS — R921 Mammographic calcification found on diagnostic imaging of breast: Secondary | ICD-10-CM

## 2017-04-05 ENCOUNTER — Other Ambulatory Visit: Payer: Self-pay | Admitting: Obstetrics and Gynecology

## 2017-04-05 DIAGNOSIS — Z803 Family history of malignant neoplasm of breast: Secondary | ICD-10-CM

## 2017-05-11 ENCOUNTER — Ambulatory Visit
Admission: RE | Admit: 2017-05-11 | Discharge: 2017-05-11 | Disposition: A | Discharge status: Home / Self Care | Payer: PRIVATE HEALTH INSURANCE | Source: Ambulatory Visit | Attending: Obstetrics and Gynecology | Admitting: Obstetrics and Gynecology

## 2017-05-11 DIAGNOSIS — Z803 Family history of malignant neoplasm of breast: Secondary | ICD-10-CM

## 2017-05-11 MED ORDER — GADOBENATE DIMEGLUMINE 529 MG/ML IV SOLN
20.0000 mL | Freq: Once | INTRAVENOUS | Status: AC | PRN
  Administered 2017-05-11: 20 mL via INTRAVENOUS

## 2018-03-20 LAB — HM MAMMOGRAPHY

## 2018-03-27 ENCOUNTER — Other Ambulatory Visit: Payer: Self-pay | Admitting: Obstetrics and Gynecology

## 2018-03-27 DIAGNOSIS — Z803 Family history of malignant neoplasm of breast: Secondary | ICD-10-CM

## 2018-08-10 ENCOUNTER — Ambulatory Visit
Admission: RE | Admit: 2018-08-10 | Discharge: 2018-08-10 | Disposition: A | Discharge status: Home / Self Care | Payer: PRIVATE HEALTH INSURANCE | Source: Ambulatory Visit | Attending: Obstetrics and Gynecology | Admitting: Obstetrics and Gynecology

## 2018-08-10 DIAGNOSIS — Z803 Family history of malignant neoplasm of breast: Secondary | ICD-10-CM

## 2018-08-10 MED ORDER — GADOBUTROL 1 MMOL/ML IV SOLN
8.0000 mL | Freq: Once | INTRAVENOUS | Status: AC | PRN
  Administered 2018-08-10: 8 mL via INTRAVENOUS

## 2018-08-26 LAB — HM PAP SMEAR
HPV 16/18/45 genotyping: NEGATIVE
HPV, high-risk: POSITIVE

## 2019-05-04 LAB — HM MAMMOGRAPHY

## 2019-05-06 ENCOUNTER — Other Ambulatory Visit: Payer: Self-pay | Admitting: Obstetrics and Gynecology

## 2019-05-06 DIAGNOSIS — R928 Other abnormal and inconclusive findings on diagnostic imaging of breast: Secondary | ICD-10-CM

## 2019-05-15 ENCOUNTER — Ambulatory Visit
Admission: RE | Admit: 2019-05-15 | Discharge: 2019-05-15 | Disposition: A | Discharge status: Home / Self Care | Payer: PRIVATE HEALTH INSURANCE | Source: Ambulatory Visit | Attending: Obstetrics and Gynecology | Admitting: Obstetrics and Gynecology

## 2019-05-15 ENCOUNTER — Other Ambulatory Visit: Payer: Self-pay

## 2019-05-15 DIAGNOSIS — R928 Other abnormal and inconclusive findings on diagnostic imaging of breast: Secondary | ICD-10-CM

## 2019-07-24 HISTORY — PX: BREAST BIOPSY: SHX20

## 2019-09-25 ENCOUNTER — Ambulatory Visit: Payer: Self-pay | Attending: Internal Medicine

## 2019-09-25 DIAGNOSIS — Z23 Encounter for immunization: Secondary | ICD-10-CM | POA: Insufficient documentation

## 2019-09-25 NOTE — Progress Notes (Signed)
   Covid-19 Vaccination Clinic  Name:  Kristi FOSSETT, MD    MRN: [id number] DOB: 19-Feb-1976  09/25/2019  Kristi Rogers was observed post Covid-19 immunization for 15 minutes without incident. She was provided with Vaccine Information Sheet and instruction to access the V-Safe system.   Kristi Rogers was instructed to call 911 with any severe reactions post vaccine: Marland Kitchen Difficulty breathing  . Swelling of face and throat  . A fast heartbeat  . A bad rash all over body  . Dizziness and weakness

## 2019-10-28 ENCOUNTER — Ambulatory Visit: Payer: PRIVATE HEALTH INSURANCE | Attending: Internal Medicine

## 2019-10-28 DIAGNOSIS — Z23 Encounter for immunization: Secondary | ICD-10-CM

## 2019-10-28 NOTE — Progress Notes (Signed)
   Covid-19 Vaccination Clinic  Name:  TABBY BEASTON, MD    MRN: [id number] DOB: 12-28-1975  10/28/2019  Ms. Patmon was observed post Covid-19 immunization for 15 minutes without incident. She was provided with Vaccine Information Sheet and instruction to access the V-Safe system.   Ms. Helbig was instructed to call 911 with any severe reactions post vaccine: Marland Kitchen Difficulty breathing  . Swelling of face and throat  . A fast heartbeat  . A bad rash all over body  . Dizziness and weakness   Immunizations Administered    Name Date Dose VIS Date Route   Pfizer COVID-19 Vaccine 10/28/2019  8:16 AM 0.3 mL 07/03/2019 Intramuscular   Manufacturer: [employer], [employer]   Lot: [id number]   NDC: [address]-6

## 2020-01-02 LAB — LAB REPORT - SCANNED
A1c: 5.6
EGFR: 110
Free T4: 6.5 ng/dL
TSH: 0.87 (ref 0.41–5.90)

## 2020-01-05 LAB — HM PAP SMEAR: HPV, high-risk: NEGATIVE

## 2020-02-16 ENCOUNTER — Other Ambulatory Visit: Payer: Self-pay | Admitting: Obstetrics and Gynecology

## 2020-02-16 DIAGNOSIS — Z9189 Other specified personal risk factors, not elsewhere classified: Secondary | ICD-10-CM

## 2020-03-23 ENCOUNTER — Ambulatory Visit
Admission: RE | Admit: 2020-03-23 | Discharge: 2020-03-23 | Disposition: A | Discharge status: Home / Self Care | Payer: PRIVATE HEALTH INSURANCE | Source: Ambulatory Visit | Attending: Obstetrics and Gynecology | Admitting: Obstetrics and Gynecology

## 2020-03-23 ENCOUNTER — Other Ambulatory Visit: Payer: Self-pay

## 2020-03-23 DIAGNOSIS — Z9189 Other specified personal risk factors, not elsewhere classified: Secondary | ICD-10-CM

## 2020-03-23 MED ORDER — GADOBUTROL 1 MMOL/ML IV SOLN
7.0000 mL | Freq: Once | INTRAVENOUS | Status: AC | PRN
  Administered 2020-03-23: 11:00:00 7 mL via INTRAVENOUS

## 2020-03-24 ENCOUNTER — Other Ambulatory Visit: Payer: Self-pay | Admitting: Obstetrics and Gynecology

## 2020-03-24 DIAGNOSIS — R9389 Abnormal findings on diagnostic imaging of other specified body structures: Secondary | ICD-10-CM

## 2020-04-04 ENCOUNTER — Inpatient Hospital Stay: Admission: RE | Admit: 2020-04-04 | Payer: PRIVATE HEALTH INSURANCE | Source: Ambulatory Visit

## 2020-04-04 ENCOUNTER — Other Ambulatory Visit: Payer: PRIVATE HEALTH INSURANCE

## 2020-04-22 ENCOUNTER — Other Ambulatory Visit: Payer: Self-pay

## 2020-04-22 ENCOUNTER — Ambulatory Visit
Admission: RE | Admit: 2020-04-22 | Discharge: 2020-04-22 | Disposition: A | Discharge status: Home / Self Care | Payer: PRIVATE HEALTH INSURANCE | Source: Ambulatory Visit | Attending: Obstetrics and Gynecology | Admitting: Obstetrics and Gynecology

## 2020-04-22 DIAGNOSIS — R9389 Abnormal findings on diagnostic imaging of other specified body structures: Secondary | ICD-10-CM

## 2020-04-22 MED ORDER — GADOBUTROL 1 MMOL/ML IV SOLN
8.0000 mL | Freq: Once | INTRAVENOUS | Status: AC | PRN
  Administered 2020-04-22: 10:00:00 8 mL via INTRAVENOUS

## 2020-08-02 ENCOUNTER — Other Ambulatory Visit: Payer: Self-pay | Admitting: Obstetrics and Gynecology

## 2020-08-02 DIAGNOSIS — R921 Mammographic calcification found on diagnostic imaging of breast: Secondary | ICD-10-CM

## 2020-08-05 ENCOUNTER — Other Ambulatory Visit: Payer: Self-pay | Admitting: Obstetrics and Gynecology

## 2020-08-05 DIAGNOSIS — Z1231 Encounter for screening mammogram for malignant neoplasm of breast: Secondary | ICD-10-CM

## 2020-09-22 ENCOUNTER — Other Ambulatory Visit: Payer: Self-pay | Admitting: Obstetrics and Gynecology

## 2020-09-22 DIAGNOSIS — Z1231 Encounter for screening mammogram for malignant neoplasm of breast: Secondary | ICD-10-CM

## 2020-09-23 ENCOUNTER — Other Ambulatory Visit: Payer: Self-pay

## 2020-09-23 ENCOUNTER — Inpatient Hospital Stay: Admission: RE | Admit: 2020-09-23 | Payer: PRIVATE HEALTH INSURANCE | Source: Ambulatory Visit

## 2020-09-23 ENCOUNTER — Ambulatory Visit
Admission: RE | Admit: 2020-09-23 | Discharge: 2020-09-23 | Disposition: A | Discharge status: Home / Self Care | Payer: PRIVATE HEALTH INSURANCE | Source: Ambulatory Visit | Attending: Obstetrics and Gynecology | Admitting: Obstetrics and Gynecology

## 2020-09-23 ENCOUNTER — Ambulatory Visit: Payer: PRIVATE HEALTH INSURANCE

## 2020-09-23 DIAGNOSIS — Z1231 Encounter for screening mammogram for malignant neoplasm of breast: Secondary | ICD-10-CM

## 2020-09-28 ENCOUNTER — Other Ambulatory Visit: Payer: Self-pay | Admitting: Obstetrics and Gynecology

## 2020-09-28 DIAGNOSIS — R928 Other abnormal and inconclusive findings on diagnostic imaging of breast: Secondary | ICD-10-CM

## 2020-10-14 ENCOUNTER — Other Ambulatory Visit: Payer: PRIVATE HEALTH INSURANCE

## 2020-10-21 ENCOUNTER — Ambulatory Visit
Admission: RE | Admit: 2020-10-21 | Discharge: 2020-10-21 | Disposition: A | Discharge status: Home / Self Care | Payer: PRIVATE HEALTH INSURANCE | Source: Ambulatory Visit | Attending: Obstetrics and Gynecology | Admitting: Obstetrics and Gynecology

## 2020-10-21 ENCOUNTER — Other Ambulatory Visit: Payer: Self-pay

## 2020-10-21 DIAGNOSIS — R928 Other abnormal and inconclusive findings on diagnostic imaging of breast: Secondary | ICD-10-CM

## 2021-04-10 ENCOUNTER — Other Ambulatory Visit: Payer: Self-pay

## 2021-04-10 ENCOUNTER — Ambulatory Visit
Admission: RE | Admit: 2021-04-10 | Discharge: 2021-04-10 | Disposition: A | Discharge status: Home / Self Care | Payer: No Typology Code available for payment source | Source: Ambulatory Visit | Attending: Obstetrics and Gynecology | Admitting: Obstetrics and Gynecology

## 2021-04-10 DIAGNOSIS — R921 Mammographic calcification found on diagnostic imaging of breast: Secondary | ICD-10-CM

## 2021-04-10 MED ORDER — GADOBUTROL 1 MMOL/ML IV SOLN
8.0000 mL | Freq: Once | INTRAVENOUS | Status: AC | PRN
  Administered 2021-04-10: 8 mL via INTRAVENOUS

## 2021-10-12 ENCOUNTER — Other Ambulatory Visit: Payer: Self-pay | Admitting: Obstetrics and Gynecology

## 2021-10-12 DIAGNOSIS — Z1231 Encounter for screening mammogram for malignant neoplasm of breast: Secondary | ICD-10-CM

## 2021-11-03 ENCOUNTER — Ambulatory Visit
Admission: RE | Admit: 2021-11-03 | Discharge: 2021-11-03 | Disposition: A | Discharge status: Home / Self Care | Payer: No Typology Code available for payment source | Source: Ambulatory Visit | Attending: Obstetrics and Gynecology | Admitting: Obstetrics and Gynecology

## 2021-11-03 DIAGNOSIS — Z1231 Encounter for screening mammogram for malignant neoplasm of breast: Secondary | ICD-10-CM

## 2021-12-22 ENCOUNTER — Other Ambulatory Visit: Payer: Self-pay | Admitting: Obstetrics and Gynecology

## 2021-12-22 DIAGNOSIS — N6310 Unspecified lump in the right breast, unspecified quadrant: Secondary | ICD-10-CM

## 2021-12-25 LAB — HM PAP SMEAR: HPV, high-risk: NEGATIVE

## 2021-12-29 ENCOUNTER — Ambulatory Visit
Admission: RE | Admit: 2021-12-29 | Discharge: 2021-12-29 | Disposition: A | Discharge status: Home / Self Care | Payer: No Typology Code available for payment source | Source: Ambulatory Visit | Attending: Obstetrics and Gynecology | Admitting: Obstetrics and Gynecology

## 2021-12-29 DIAGNOSIS — N6310 Unspecified lump in the right breast, unspecified quadrant: Secondary | ICD-10-CM

## 2022-05-21 ENCOUNTER — Other Ambulatory Visit: Payer: Self-pay | Admitting: Obstetrics and Gynecology

## 2022-05-21 DIAGNOSIS — Z803 Family history of malignant neoplasm of breast: Secondary | ICD-10-CM

## 2022-06-29 ENCOUNTER — Ambulatory Visit (INDEPENDENT_AMBULATORY_CARE_PROVIDER_SITE_OTHER): Payer: No Typology Code available for payment source | Admitting: Family Medicine

## 2022-06-29 ENCOUNTER — Encounter: Payer: Self-pay | Admitting: Family Medicine

## 2022-06-29 VITALS — BP 110/84 | HR 95 | Temp 98.5°F | Ht 65.25 in | Wt 176.4 lb

## 2022-06-29 DIAGNOSIS — R311 Benign essential microscopic hematuria: Secondary | ICD-10-CM

## 2022-06-29 DIAGNOSIS — Z9189 Other specified personal risk factors, not elsewhere classified: Secondary | ICD-10-CM | POA: Diagnosis not present

## 2022-06-29 DIAGNOSIS — J302 Other seasonal allergic rhinitis: Secondary | ICD-10-CM | POA: Insufficient documentation

## 2022-06-29 DIAGNOSIS — N200 Calculus of kidney: Secondary | ICD-10-CM | POA: Insufficient documentation

## 2022-06-29 DIAGNOSIS — K219 Gastro-esophageal reflux disease without esophagitis: Secondary | ICD-10-CM | POA: Insufficient documentation

## 2022-06-29 DIAGNOSIS — Z7689 Persons encountering health services in other specified circumstances: Secondary | ICD-10-CM

## 2022-06-29 DIAGNOSIS — E7841 Elevated Lipoprotein(a): Secondary | ICD-10-CM

## 2022-06-29 NOTE — Progress Notes (Signed)
Patient presents to clinic today to follow-up and establish care.  SUBJECTIVE: PMH: Patient is a 46 year old female who is overall healthy.  Patient's been seen by OB/GYN for regular exams and labs.  Seasonal allergies: -Takes cetirizine as needed -Symptoms relieved by may use Flonase  GERD: -Occasional symptoms.  Noticed more with wt gain and eating late at night -May take Tums if needed  History of anemia: -In the past required an iron infusion -Hemoglobin was normal at last check  High risk for breast cancer: -Patient endorses history of breast cancer in her sister. -having screening mammograms q 6 mo at [facility] center -[facility] Dr. Renaldo Fiddler for OB/Gyn care.  Asymptomatic hematuria: -previously worked up x 2 -renal calculi in R kidney stable x a while  HLD: -Patient notes history of slightly elevated LDL. -Last lipid panel July 2023 with OB/GYN  Weight gain: -Patient interested in losing weight. -States has exercise ball, stand-up desk, floor bicycle pedals but is yet to start using -Cooking at home.  Typically eats dinner by 7 PM.  Allergies: NKDA  Past Surgical hx: -Breast biopsy, lumpectomy C-section-2008, 2014 2000-left salpingo-oophorectomy 2/2 ovarian torsion  Social history: Patient is married.  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  Patient has 2 kids.  Patient endorses rare social alcohol use.  Patient denies tobacco or drug use.  Health Maintenance: Dental -- Vision -- Immunizations -- Colonoscopy -- Mammogram --10/2021 PAP -- 01/2022 LMP--06/08/22 Bone Density --   Family hx: Mom-hearing loss Dad-heart disease, HLD, HTN Sister-breast cancer MGM-arthritis, pancreatic cancer MGF-alcohol abuse, arthritis PGF-arthritis  Past Medical History:  Diagnosis Date   Anemia    Headache(784.0)    Heartburn in pregnancy    History of kidney stones    Hyperlipidemia    Microscopic hematuria    Migraines     Shortness of breath    with pregnancy wt gain   UTI (lower urinary tract infection)     Past Surgical History:  Procedure Laterality Date   BREAST BIOPSY Right 2018   BREAST BIOPSY Left 2021   CESAREAN SECTION  2008   CESAREAN SECTION N/A 12/10/2012   Procedure: REPEAT CESAREAN SECTION;  Surgeon: Mitchel Honour, DO;  Location: [facility];  Service: Obstetrics;  Laterality: N/A;  Repeat edc 12/17/12   OVARIAN CYST REMOVAL     SALPINGOOPHORECTOMY  2000    Current Outpatient Medications on File Prior to Visit  Medication Sig Dispense Refill   cetirizine (ZYRTEC) 10 MG tablet 2 tablets every day by oral route.     Cholecalciferol (VITAMIN D3) 50 MCG (2000 UT) capsule      clindamycin (CLINDAGEL) 1 % gel APPLY TOPICALLY TO THE AFFECTED AREA EVERY DAY IN THE MORNING     doxycycline (VIBRA-TABS) 100 MG tablet Take 100 mg by mouth daily.     Multiple Vitamin (MULTIVITAMIN PO) Multivitamin     Omega-3 Fatty Acids (FISH OIL) 1200 MG CAPS Take 2 capsules by mouth daily.     No current facility-administered medications on file prior to visit.    No Known Allergies  Family History  Problem Relation Age of Onset   Cancer Sister        breast   Breast cancer Sister 65   Arthritis Maternal Grandmother    Hyperlipidemia Maternal Grandmother    Heart disease Maternal Grandmother    Alcohol abuse Maternal Grandfather    Arthritis Maternal Grandfather    Hyperlipidemia Maternal Grandfather    Heart  disease Maternal Grandfather    Kidney disease Maternal Grandfather    Arthritis Paternal Grandmother    Arthritis Paternal Grandfather    Hyperlipidemia Paternal Grandfather     Social History   Socioeconomic History   Marital status: Married    Spouse name: Not on file   Number of children: Not on file   Years of education: Not on file   Highest education level: Not on file  Occupational History   Not on file  Tobacco Use   Smoking status: Never   Smokeless tobacco: Not on file   Substance and Sexual Activity   Alcohol use: Yes    Comment: occassionally   Drug use: No   Sexual activity: Not on file  Other Topics Concern   Not on file  Social History Narrative   Not on file   Social Determinants of Health   Financial Resource Strain: Not on file  Food Insecurity: Not on file  Transportation Needs: Not on file  Physical Activity: Not on file  Stress: Not on file  Social Connections: Not on file  Intimate Partner Violence: Not on file    ROS General: Denies fever, chills, night sweats, changes in weight, changes in appetite HEENT: Denies headaches, ear pain, changes in vision, rhinorrhea, sore throat CV: Denies CP, palpitations, SOB, orthopnea Pulm: Denies SOB, cough, wheezing GI: Denies abdominal pain, nausea, vomiting, diarrhea, constipation GU: Denies dysuria, hematuria, frequency, vaginal discharge Msk: Denies muscle cramps, joint pains Neuro: Denies weakness, numbness, tingling Skin: Denies rashes, bruising Psych: Denies depression, anxiety, hallucinations  BP 110/84 (BP Location: Right Arm, Patient Position: Sitting, Cuff Size: Normal)   Pulse 95   Temp 98.5 F (36.9 C) (Oral)   Ht 5' 5.25" (1.657 m)   Wt 176 lb 6.4 oz (80 kg)   LMP 06/08/2022 (Approximate)   SpO2 96%   BMI 29.13 kg/m   Physical Exam Gen. Pleasant, well developed, well-nourished, in NAD HEENT - [place]/AT, PERRL, EOMI, conjunctive clear, no scleral icterus, no nasal drainage, pharynx without erythema or exudate.  TMs normal bilaterally. Neck: No JVD, no thyromegaly, no carotid bruits Lungs: no use of accessory muscles, CTAB, no wheezes, rales or rhonchi Cardiovascular: RRR, No r/g/m, no peripheral edema Musculoskeletal: No deformities, moves all four extremities, no cyanosis or clubbing, normal tone Neuro:  A&Ox3, CN II-XII intact, normal gait Skin:  Warm, dry, intact, no lesions  No results found for this or any previous visit (from the past 2160  hour(s)).  Assessment/Plan: Benign microscopic hematuria -Stable -Asymptomatic  Gastroesophageal reflux disease, unspecified whether esophagitis present -Avoid foods known to cause symptoms -Lifestyle modifications -Tums as needed  At high risk for breast cancer -History of breast cancer in pt sister -Continue every 6 month screening alternating MRI and regular mammogram  Seasonal allergies -Intermittent symptoms -Continue OTC cetirizine as needed and Flonase for increased symptoms  Renal calculi -Stable -Noted on imaging in right kidney without movement  Elevated lipoprotein -Lifestyle modifications  Encounter to establish care -We reviewed the PMH, PSH, FH, SH, Meds and Allergies. -We provided refills for any medications we will prescribe as needed. -We addressed current concerns per orders and patient instructions. -We have asked for records for pertinent exams, studies, vaccines and notes from previous providers. -We have advised patient to follow up per instructions below.  F/u prn.  Abbe Amsterdam, MD

## 2022-07-14 ENCOUNTER — Other Ambulatory Visit: Payer: No Typology Code available for payment source

## 2022-07-20 ENCOUNTER — Ambulatory Visit: Payer: No Typology Code available for payment source | Admitting: Family Medicine

## 2022-09-11 ENCOUNTER — Other Ambulatory Visit: Payer: Self-pay | Admitting: Obstetrics and Gynecology

## 2022-09-11 DIAGNOSIS — N6320 Unspecified lump in the left breast, unspecified quadrant: Secondary | ICD-10-CM

## 2022-09-28 ENCOUNTER — Other Ambulatory Visit: Payer: Self-pay | Admitting: Obstetrics and Gynecology

## 2022-09-28 ENCOUNTER — Ambulatory Visit
Admission: RE | Admit: 2022-09-28 | Discharge: 2022-09-28 | Disposition: A | Discharge status: Home / Self Care | Payer: No Typology Code available for payment source | Source: Ambulatory Visit | Attending: Obstetrics and Gynecology | Admitting: Obstetrics and Gynecology

## 2022-09-28 DIAGNOSIS — N6320 Unspecified lump in the left breast, unspecified quadrant: Secondary | ICD-10-CM

## 2022-10-06 ENCOUNTER — Ambulatory Visit
Admission: RE | Admit: 2022-10-06 | Discharge: 2022-10-06 | Disposition: A | Discharge status: Home / Self Care | Payer: No Typology Code available for payment source | Source: Ambulatory Visit | Attending: Obstetrics and Gynecology | Admitting: Obstetrics and Gynecology

## 2022-10-06 DIAGNOSIS — Z803 Family history of malignant neoplasm of breast: Secondary | ICD-10-CM

## 2022-10-06 MED ORDER — GADOPICLENOL 0.5 MMOL/ML IV SOLN
8.0000 mL | Freq: Once | INTRAVENOUS | Status: AC | PRN
  Administered 2022-10-06: 8 mL via INTRAVENOUS

## 2023-07-05 ENCOUNTER — Telehealth: Payer: Self-pay | Admitting: Family Medicine

## 2023-07-05 ENCOUNTER — Ambulatory Visit: Payer: Commercial Managed Care - PPO | Admitting: Family Medicine

## 2023-07-05 ENCOUNTER — Encounter: Payer: Self-pay | Admitting: Family Medicine

## 2023-07-05 VITALS — BP 110/72 | HR 73 | Temp 98.5°F | Ht 66.14 in | Wt 179.2 lb

## 2023-07-05 DIAGNOSIS — Z1159 Encounter for screening for other viral diseases: Secondary | ICD-10-CM

## 2023-07-05 DIAGNOSIS — Z862 Personal history of diseases of the blood and blood-forming organs and certain disorders involving the immune mechanism: Secondary | ICD-10-CM

## 2023-07-05 DIAGNOSIS — L853 Xerosis cutis: Secondary | ICD-10-CM | POA: Diagnosis not present

## 2023-07-05 DIAGNOSIS — Z23 Encounter for immunization: Secondary | ICD-10-CM | POA: Diagnosis not present

## 2023-07-05 DIAGNOSIS — E559 Vitamin D deficiency, unspecified: Secondary | ICD-10-CM

## 2023-07-05 DIAGNOSIS — Z0000 Encounter for general adult medical examination without abnormal findings: Secondary | ICD-10-CM | POA: Diagnosis not present

## 2023-07-05 DIAGNOSIS — E7841 Elevated Lipoprotein(a): Secondary | ICD-10-CM

## 2023-07-05 LAB — LIPID PANEL
Cholesterol: 233 mg/dL — ABNORMAL HIGH (ref 0–200)
HDL: 61.9 mg/dL (ref >39.00)
LDL Cholesterol: 140 mg/dL — ABNORMAL HIGH (ref 0–99)
NonHDL: 170.96
Total CHOL/HDL Ratio: 4
Triglycerides: 156 mg/dL — ABNORMAL HIGH (ref 0.0–149.0)
VLDL: 31.2 mg/dL (ref 0.0–40.0)

## 2023-07-05 LAB — CBC WITH DIFFERENTIAL/PLATELET
Basophils Absolute: 0.1 10*3/uL (ref 0.0–0.1)
Basophils Relative: 1.1 % (ref 0.0–3.0)
Eosinophils Absolute: 0.1 10*3/uL (ref 0.0–0.7)
Eosinophils Relative: 1.7 % (ref 0.0–5.0)
HCT: 37.4 % (ref 36.0–46.0)
Hemoglobin: 12.5 g/dL (ref 12.0–15.0)
Lymphocytes Relative: 31.5 % (ref 12.0–46.0)
Lymphs Abs: 2 10*3/uL (ref 0.7–4.0)
MCHC: 33.4 g/dL (ref 30.0–36.0)
MCV: 82.2 fL (ref 78.0–100.0)
Monocytes Absolute: 0.4 10*3/uL (ref 0.1–1.0)
Monocytes Relative: 6.1 % (ref 3.0–12.0)
Neutro Abs: 3.8 10*3/uL (ref 1.4–7.7)
Neutrophils Relative %: 59.6 % (ref 43.0–77.0)
Platelets: 356 10*3/uL (ref 150.0–400.0)
RBC: 4.55 Mil/uL (ref 3.87–5.11)
RDW: 14.2 % (ref 11.5–15.5)
WBC: 6.4 10*3/uL (ref 4.0–10.5)

## 2023-07-05 LAB — COMPREHENSIVE METABOLIC PANEL
ALT: 34 U/L (ref 0–35)
AST: 24 U/L (ref 0–37)
Albumin: 4.5 g/dL (ref 3.5–5.2)
Alkaline Phosphatase: 48 U/L (ref 39–117)
BUN: 9 mg/dL (ref 6–23)
CO2: 30 meq/L (ref 19–32)
Calcium: 9.6 mg/dL (ref 8.4–10.5)
Chloride: 103 meq/L (ref 96–112)
Creatinine, Ser: 0.78 mg/dL (ref 0.40–1.20)
GFR: 91.11 mL/min (ref >60.00)
Glucose, Bld: 88 mg/dL (ref 70–99)
Potassium: 4 meq/L (ref 3.5–5.1)
Sodium: 142 meq/L (ref 135–145)
Total Bilirubin: 0.4 mg/dL (ref 0.2–1.2)
Total Protein: 7.3 g/dL (ref 6.0–8.3)

## 2023-07-05 LAB — HEMOGLOBIN A1C: Hgb A1c MFr Bld: 5.6 % (ref 4.6–6.5)

## 2023-07-05 LAB — VITAMIN D 25 HYDROXY (VIT D DEFICIENCY, FRACTURES): VITD: 36.45 ng/mL (ref 30.00–100.00)

## 2023-07-05 LAB — T4, FREE: Free T4: 0.81 ng/dL (ref 0.60–1.60)

## 2023-07-05 LAB — TSH: TSH: 0.96 u[IU]/mL (ref 0.35–5.50)

## 2023-07-05 NOTE — Patient Instructions (Signed)
It was nice seeing you today.  Check on colon cancer screening preference with your insurance company and let let us know how you would like to proceed.    Your next physical is in 1 year.   Follow-up as needed for any other concerns.

## 2023-07-05 NOTE — Telephone Encounter (Addendum)
Pt was just seen by MD this morning.  Pt called her insurance, and has decided to go with the Cologuard,  as per previous discussion with MD.  Pt is asking that MD please order kit.

## 2023-07-05 NOTE — Progress Notes (Unsigned)
Established Patient Office Visit   Subjective  Patient ID: Edwena Felty, MD, female    DOB: [birth year]  Age: 47 y.o. MRN: [id number]  Chief Complaint  Patient presents with  . Annual Exam    Fasting     Patient is a 47 year old female seen for CPE.  Patient states she is doing well overall.  Having continued issues with dry skin.  Previously seen by dermatology.  Tried CeraVe and Eucerin for skin without relief.  Does not take hot showers.  Drinking plenty of water daily.  Patient had mammogram earlier this year.  Has Pap scheduled on Friday.  Patient interested in colon cancer screening however needs to verify with insurance on preference of Cologuard versus colonoscopy and location.  Patient states her insurance requires things to be done with [place] [facility] in [place] with the exception of primary care.  Patient had COVID-vaccine on 04/30/2023 and flu vaccine 05/31/2023 at [facility] pharmacy.  Tdap last done 2014.  Mammogram done 10/06/2022.  Pap scheduled later this week.   Patient Active Problem List   Diagnosis Date Noted  . Benign microscopic hematuria 06/29/2022  . Gastroesophageal reflux disease 06/29/2022  . At high risk for breast cancer 06/29/2022  . Seasonal allergies 06/29/2022  . Renal calculi 06/29/2022  . Elevated lipoprotein(a) 06/29/2022   Past Medical History:  Diagnosis Date  . Anemia   . Headache(784.0)   . Heartburn in pregnancy   . History of kidney stones   . Hyperlipidemia   . Microscopic hematuria   . Migraines   . Shortness of breath    with pregnancy wt gain  . UTI (lower urinary tract infection)    Past Surgical History:  Procedure Laterality Date  . BREAST BIOPSY Right 2018  . BREAST BIOPSY Left 2021  . CESAREAN SECTION  2008  . CESAREAN SECTION N/A 12/10/2012   Procedure: REPEAT CESAREAN SECTION;  Surgeon: Mitchel Honour, DO;  Location: [facility];  Service: Obstetrics;  Laterality: N/A;  Repeat edc 12/17/12  .  OVARIAN CYST REMOVAL    . SALPINGOOPHORECTOMY  2000   Social History   Tobacco Use  . Smoking status: Never  Substance Use Topics  . Alcohol use: Yes    Comment: occassionally  . Drug use: No   Family History  Problem Relation Age of Onset  . Cancer Sister        breast  . Breast cancer Sister 34  . Arthritis Maternal Grandmother   . Hyperlipidemia Maternal Grandmother   . Heart disease Maternal Grandmother   . Alcohol abuse Maternal Grandfather   . Arthritis Maternal Grandfather   . Hyperlipidemia Maternal Grandfather   . Heart disease Maternal Grandfather   . Kidney disease Maternal Grandfather   . Arthritis Paternal Grandmother   . Arthritis Paternal Grandfather   . Hyperlipidemia Paternal Grandfather    No Known Allergies    ROS Negative unless stated above    Objective:     BP 110/72 (BP Location: Left Arm, Patient Position: Sitting, Cuff Size: Large)   Pulse 73   Temp 98.5 F (36.9 C) (Oral)   Ht 5' 6.14" (1.68 m)   Wt 179 lb 3.2 oz (81.3 kg)   LMP 06/17/2023 (Exact Date)   SpO2 97%   BMI 28.80 kg/m  BP Readings from Last 3 Encounters:  07/05/23 110/72  06/29/22 110/84  12/12/12 111/70   Wt Readings from Last 3 Encounters:  07/05/23 179 lb 3.2 oz (81.3 kg)  06/29/22 176 lb 6.4 oz (80 kg)  12/10/12 195 lb (88.5 kg)      Physical Exam Constitutional:      Appearance: Normal appearance.  HENT:     Head: Normocephalic and atraumatic.     Right Ear: Tympanic membrane, ear canal and external ear normal.     Left Ear: Tympanic membrane, ear canal and external ear normal.     Nose: Nose normal.     Mouth/Throat:     Mouth: Mucous membranes are moist.     Pharynx: No oropharyngeal exudate or posterior oropharyngeal erythema.  Eyes:     General: No scleral icterus.    Extraocular Movements: Extraocular movements intact.     Conjunctiva/sclera: Conjunctivae normal.     Pupils: Pupils are equal, round, and reactive to light.  Neck:     Thyroid:  No thyromegaly.  Cardiovascular:     Rate and Rhythm: Normal rate and regular rhythm.     Pulses: Normal pulses.     Heart sounds: Normal heart sounds. No murmur heard.    No friction rub.  Pulmonary:     Effort: Pulmonary effort is normal.     Breath sounds: Normal breath sounds. No wheezing, rhonchi or rales.  Abdominal:     General: Bowel sounds are normal.     Palpations: Abdomen is soft.     Tenderness: There is no abdominal tenderness.  Musculoskeletal:        General: No deformity. Normal range of motion.  Lymphadenopathy:     Cervical: No cervical adenopathy.  Skin:    General: Skin is warm and dry.     Findings: No lesion.  Neurological:     General: No focal deficit present.     Mental Status: She is alert and oriented to person, place, and time.  Psychiatric:        Mood and Affect: Mood normal.        Thought Content: Thought content normal.     No results found for any visits on 07/05/23.    Assessment & Plan:  Well adult exam -     Hemoglobin A1c; Future  Elevated lipoprotein(a) -     Comprehensive metabolic panel; Future -     Lipid panel; Future  Need for Tdap vaccination  Vitamin D deficiency -     VITAMIN D 25 Hydroxy (Vit-D Deficiency, Fractures); Future  History of anemia -     CBC with Differential/Platelet; Future -     Iron, TIBC and Ferritin Panel; Future  Dry skin -     Comprehensive metabolic panel; Future -     Hemoglobin A1c; Future -     TSH; Future -     T4, free; Future  Age-appropriate health screenings discussed.  Obtain labs.  Immunizations reviewed.  COVID-19 and influenza vaccines given at [facility] on 04/30/2023 and 05/31/2023 respectively.  Tdap due as last given 09/11/2012.  Given this visit.  Mammogram done 10/06/2022.  Patient to contact insurance regarding preference for Cologuard versus colonoscopy (at [facility] or locally) for colon cancer screening and notify clinic.  Continue follow-up with dermatology as needed  for dry skin.  Consider using coconut oil or other natural oils for moisturizer.  Return in about 1 year (around 07/04/2024) for physical.  Sooner if needed.  Deeann Saint, MD

## 2023-07-06 LAB — IRON,TIBC AND FERRITIN PANEL
%SAT: 11 % — ABNORMAL LOW (ref 16–45)
Ferritin: 6 ng/mL — ABNORMAL LOW (ref 16–232)
Iron: 40 ug/dL (ref 40–190)
TIBC: 364 ug/dL (ref 250–450)

## 2023-07-06 LAB — HEPATITIS C ANTIBODY: Hepatitis C Ab: NONREACTIVE

## 2023-07-18 ENCOUNTER — Encounter: Payer: Self-pay | Admitting: Family Medicine

## 2023-07-23 ENCOUNTER — Other Ambulatory Visit: Payer: Self-pay | Admitting: Family Medicine

## 2023-07-23 DIAGNOSIS — Z1211 Encounter for screening for malignant neoplasm of colon: Secondary | ICD-10-CM

## 2023-07-23 NOTE — Progress Notes (Signed)
c 

## 2023-07-23 NOTE — Telephone Encounter (Signed)
Please inform pt order was placed and cologuard should arrive via mail.

## 2023-08-16 LAB — COLOGUARD: COLOGUARD: NEGATIVE

## 2023-08-22 ENCOUNTER — Encounter: Payer: Self-pay | Admitting: Family Medicine

## 2023-09-10 ENCOUNTER — Telehealth: Payer: Commercial Managed Care - PPO | Admitting: Family Medicine

## 2023-09-10 VITALS — Ht 66.14 in

## 2023-09-10 DIAGNOSIS — Z20828 Contact with and (suspected) exposure to other viral communicable diseases: Secondary | ICD-10-CM

## 2023-09-10 MED ORDER — OSELTAMIVIR PHOSPHATE 75 MG PO CAPS: 75.0000 mg | ORAL_CAPSULE | Freq: Every day | ORAL | 0 refills | Status: AC

## 2023-09-10 NOTE — Progress Notes (Signed)
Virtual Visit via Video Note I connected with Edwena Felty, MD on 09/10/2023 by a video enabled telemedicine application and verified that I am speaking with the correct person using two identifiers. Location patient: home Location provider:work office Persons participating in the virtual visit: patient, provider, scribe  I discussed the limitations of evaluation and management by telemedicine and the availability of in person appointments. The patient expressed understanding and agreed to proceed.  Chief Complaint  Patient presents with   flu exposure   HPI: Ms. Kristi Rogers is a 48 y.o. female with a PMHx significant for GERD, renal calculi, and seasonal allergies who is being seen on video today for flu exposure.  Patient states her daughter was dx'ed with flu this morning. Her symptoms began yesterday.  She is having very slight "scratchy throat" this morning, but no other symptoms.  No hx of asthma, DM II, or HTN.  Denies fever, body aches,changes in appetite,unusual fatigue, rhinorrhea,nasal congestion, cough,wheezing,abdominal pain,nausea, or changes in bowel habits.  ROS: See pertinent positives and negatives per HPI.  Past Medical History:  Diagnosis Date   Anemia    Headache(784.0)    Heartburn in pregnancy    History of kidney stones    Hyperlipidemia    Microscopic hematuria    Migraines    Shortness of breath    with pregnancy wt gain   UTI (lower urinary tract infection)     Past Surgical History:  Procedure Laterality Date   BREAST BIOPSY Right 2018   BREAST BIOPSY Left 2021   CESAREAN SECTION  2008   CESAREAN SECTION N/A 12/10/2012   Procedure: REPEAT CESAREAN SECTION;  Surgeon: Mitchel Honour, DO;  Location: [facility];  Service: Obstetrics;  Laterality: N/A;  Repeat edc 12/17/12   OVARIAN CYST REMOVAL     SALPINGOOPHORECTOMY  2000    Family History  Problem Relation Age of Onset   Cancer Sister        breast   Breast cancer Sister 61    Arthritis Maternal Grandmother    Hyperlipidemia Maternal Grandmother    Heart disease Maternal Grandmother    Alcohol abuse Maternal Grandfather    Arthritis Maternal Grandfather    Hyperlipidemia Maternal Grandfather    Heart disease Maternal Grandfather    Kidney disease Maternal Grandfather    Arthritis Paternal Grandmother    Arthritis Paternal Grandfather    Hyperlipidemia Paternal Grandfather     Social History   Socioeconomic History   Marital status: Married    Spouse name: Not on file   Number of children: Not on file   Years of education: Not on file   Highest education level: Professional school degree (e.g., MD, DDS, DVM, JD)  Occupational History   Not on file  Tobacco Use   Smoking status: Never   Smokeless tobacco: Not on file  Substance and Sexual Activity   Alcohol use: Yes    Comment: occassionally   Drug use: No   Sexual activity: Not on file  Other Topics Concern   Not on file  Social History Narrative   Not on file   Social Drivers of Health   Financial Resource Strain: Low Risk  (09/10/2023)   Overall Financial Resource Strain (CARDIA)    Difficulty of Paying Living Expenses: Not hard at all  Food Insecurity: No Food Insecurity (09/10/2023)   Hunger Vital Sign    Worried About Running Out of Food in the Last Year: Never true    Ran Out of  Food in the Last Year: Never true  Transportation Needs: No Transportation Needs (09/10/2023)   PRAPARE - Administrator, Civil Service (Medical): No    Lack of Transportation (Non-Medical): No  Physical Activity: Insufficiently Active (09/10/2023)   Exercise Vital Sign    Days of Exercise per Week: 1 day    Minutes of Exercise per Session: 20 min  Stress: No Stress Concern Present (09/10/2023)   [employer] of Occupational Health - Occupational Stress Questionnaire    Feeling of Stress : Not at all  Social Connections: Socially Integrated (09/10/2023)   Social Connection and Isolation Panel  [NHANES]    Frequency of Communication with Friends and Family: More than three times a week    Frequency of Social Gatherings with Friends and Family: More than three times a week    Attends Religious Services: 1 to 4 times per year    Active Member of [employer] or Organizations: Yes    Attends Banker Meetings: 1 to 4 times per year    Marital Status: Married  Catering manager Violence: Unknown (10/26/2021)   Received from [employer], [facility]   HITS    Physically Hurt: Not on file    Insult or Talk Down To: Not on file    Threaten Physical Harm: Not on file    Scream or Curse: Not on file     Current Outpatient Medications:    cetirizine (ZYRTEC) 10 MG tablet, 2 tablets every day by oral route., Disp: , Rfl:    Cholecalciferol (VITAMIN D3) 50 MCG (2000 UT) capsule, , Disp: , Rfl:    clindamycin (CLINDAGEL) 1 % gel, APPLY TOPICALLY TO THE AFFECTED AREA EVERY DAY IN THE MORNING, Disp: , Rfl:    doxycycline (VIBRA-TABS) 100 MG tablet, Take 100 mg by mouth daily., Disp: , Rfl:    Multiple Vitamin (MULTIVITAMIN PO), Multivitamin, Disp: , Rfl:    Omega-3 Fatty Acids (FISH OIL) 1200 MG CAPS, Take 2 capsules by mouth daily., Disp: , Rfl:    oseltamivir (TAMIFLU) 75 MG capsule, Take 1 capsule (75 mg total) by mouth daily for 7 days., Disp: 7 capsule, Rfl: 0  EXAM:  VITALS per patient if applicable:Ht 5' 6.14" (1.68 m)   BMI 28.80 kg/m   GENERAL: alert, oriented, appears well and in no acute distress  HEENT: atraumatic, conjunctiva clear, no obvious abnormalities on inspection of external nose and ears  NECK: normal movements of the head and neck  LUNGS: on inspection no signs of respiratory distress, breathing rate appears normal, no obvious gross SOB, gasping or wheezing  CV: no obvious cyanosis  MS: moves all visible extremities without noticeable abnormality  PSYCH/NEURO: pleasant and cooperative, no obvious depression or anxiety, speech and thought processing  grossly intact  ASSESSMENT AND PLAN:  Discussed the following assessment and plan:  Exposure to the flu - Plan: oseltamivir (TAMIFLU) 75 MG capsule Her daughter started with symptoms yesterday and Dx'ed with influenza today. She is not having flu like symptoms at this time. Monitor for sxs onset. Continue contact precautions. For now will recommend prophylactic treatment as requested, Tamiflu 75 mg daily x 7 d to start asap.  We discussed possible serious and likely etiologies, options for evaluation and workup, limitations of telemedicine visit vs in person visit, treatment, treatment risks and precautions. The patient was advised to call back or seek an in-person evaluation if the symptoms worsen or if the condition fails to improve as anticipated. I discussed the  assessment and treatment plan with the patient. The patient was provided an opportunity to ask questions and all were answered. The patient agreed with the plan and demonstrated an understanding of the instructions.  Return if symptoms worsen or fail to improve.  I, Kristi Rogers, acting as a scribe for Kristi Achorn [place], MD., have documented all relevant documentation on the behalf of Kristi Krotz [place], MD, as directed by  Kristi Fouse [place], MD while in the presence of Kristi Chrestman [place], MD.   I, Kristi Modisette [place], MD, have reviewed all documentation for this visit. The documentation on 09/10/23 for the exam, diagnosis, procedures, and orders are all accurate and complete.  Kristi Waldrep [place], MD

## 2023-12-13 ENCOUNTER — Other Ambulatory Visit: Payer: Self-pay | Admitting: Obstetrics and Gynecology

## 2023-12-13 ENCOUNTER — Encounter: Payer: Self-pay | Admitting: Obstetrics and Gynecology

## 2023-12-13 DIAGNOSIS — N6452 Nipple discharge: Secondary | ICD-10-CM

## 2024-01-14 ENCOUNTER — Ambulatory Visit
Admission: RE | Admit: 2024-01-14 | Discharge: 2024-01-14 | Disposition: A | Discharge status: Home / Self Care | Source: Ambulatory Visit | Attending: Obstetrics and Gynecology | Admitting: Obstetrics and Gynecology

## 2024-01-14 DIAGNOSIS — N6452 Nipple discharge: Secondary | ICD-10-CM

## 2024-01-15 ENCOUNTER — Other Ambulatory Visit: Payer: Self-pay | Admitting: Obstetrics and Gynecology

## 2024-01-15 DIAGNOSIS — N6310 Unspecified lump in the right breast, unspecified quadrant: Secondary | ICD-10-CM

## 2024-01-16 ENCOUNTER — Ambulatory Visit
Admission: RE | Admit: 2024-01-16 | Discharge: 2024-01-16 | Disposition: A | Discharge status: Home / Self Care | Source: Ambulatory Visit | Attending: Obstetrics and Gynecology | Admitting: Obstetrics and Gynecology

## 2024-01-16 DIAGNOSIS — N6310 Unspecified lump in the right breast, unspecified quadrant: Secondary | ICD-10-CM

## 2024-01-16 HISTORY — PX: BREAST BIOPSY: SHX20

## 2024-01-17 LAB — SURGICAL PATHOLOGY

## 2024-03-06 IMAGING — MG MM DIGITAL SCREENING BILAT W/ TOMO AND CAD
8 series · 8 of 24 positions shown · non-contrast
Comparison: Previous exam(s).

CLINICAL DATA: Screening.

EXAM:
DIGITAL SCREENING BILATERAL MAMMOGRAM WITH TOMOSYNTHESIS AND CAD
TECHNIQUE: Bilateral screening digital craniocaudal and mediolateral oblique
mammograms were obtained. Bilateral screening digital breast
tomosynthesis was performed. The images were evaluated with
computer-aided detection.

[L CC synth-2D]
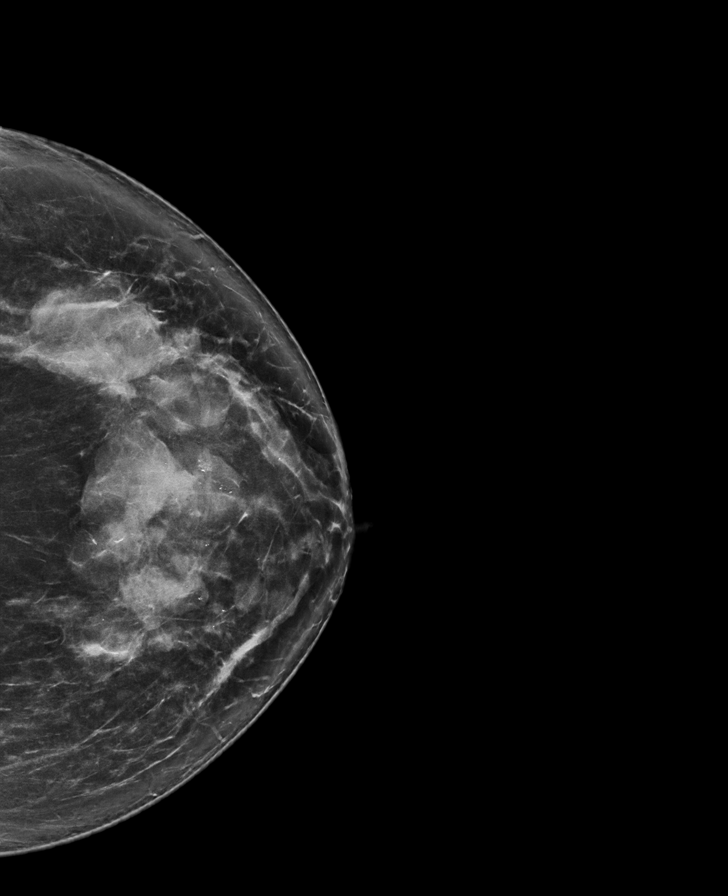

[L MLO synth-2D]
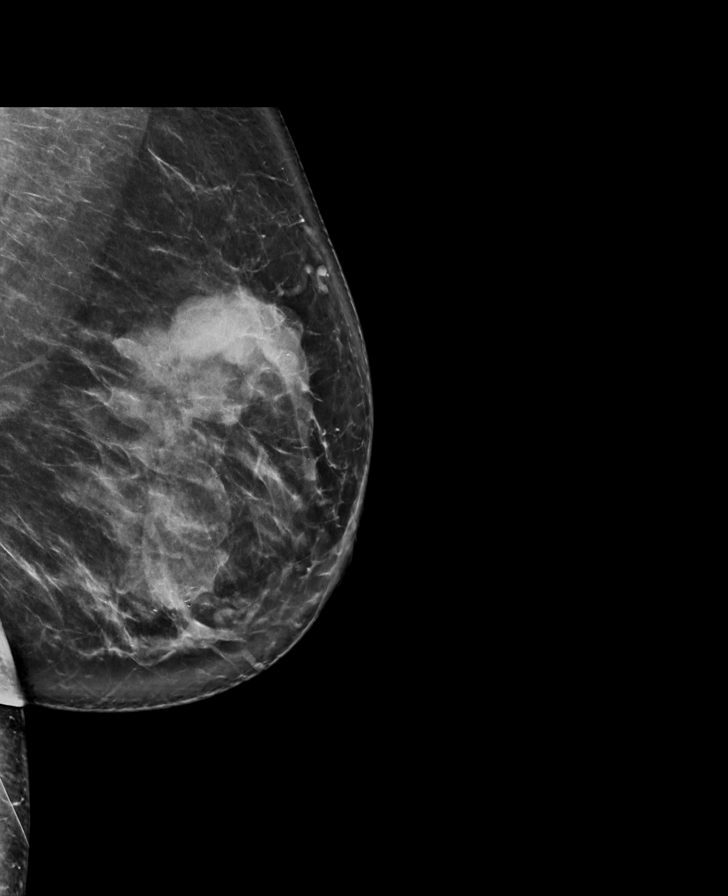

[R MLO synth-2D]
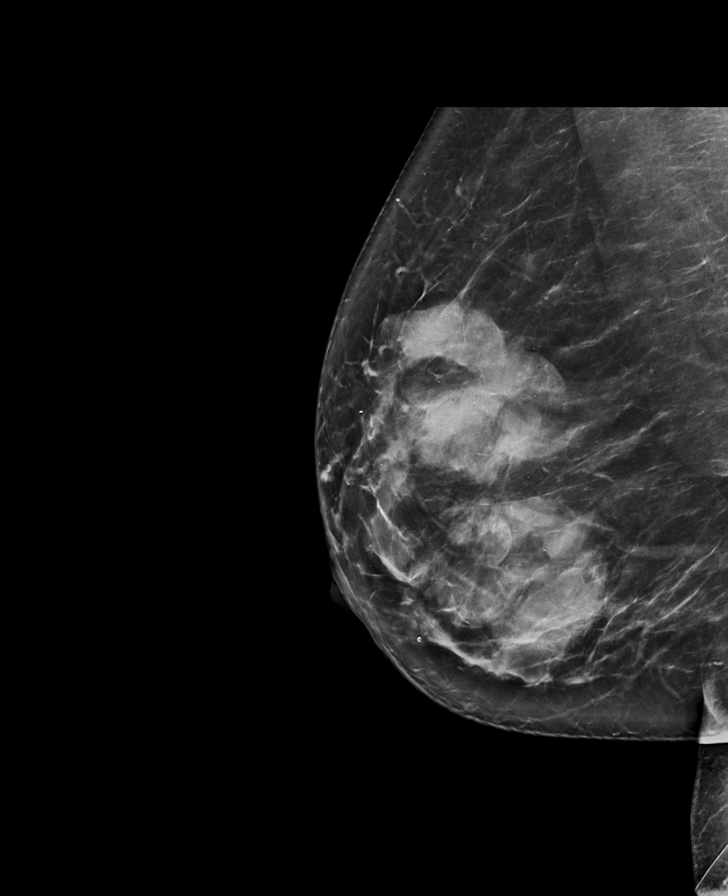

[R CC synth-2D]
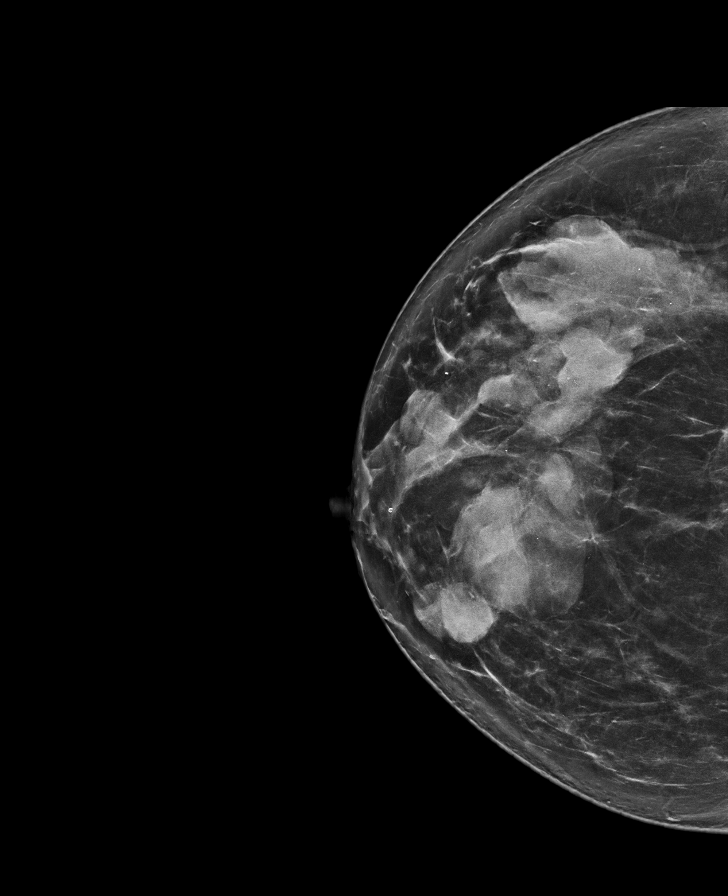

[L MLO tomo · tomo slice 50/99.0]
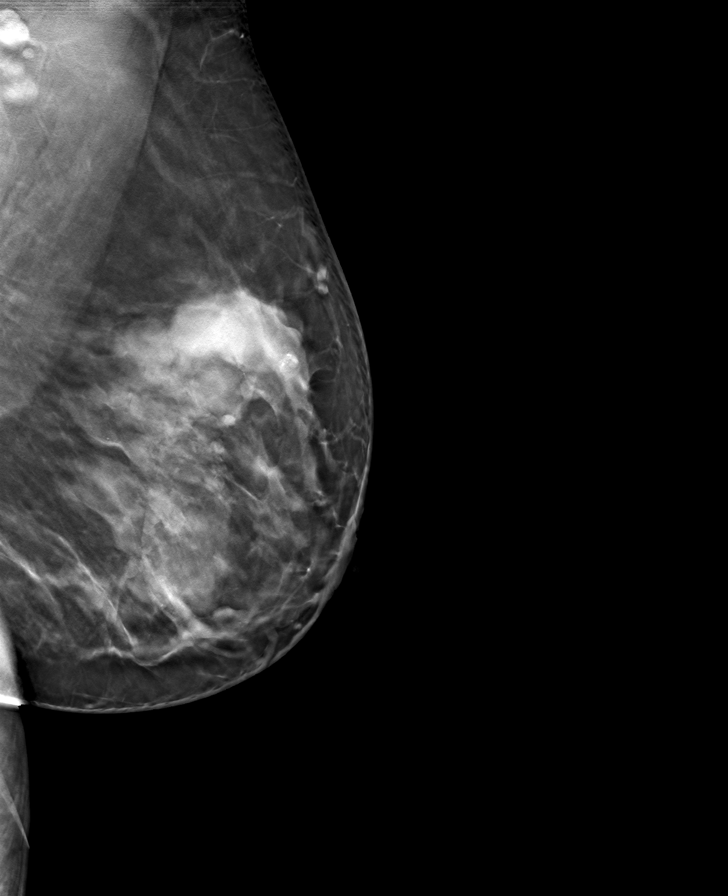

[L CC tomo · tomo slice 43/86.0]
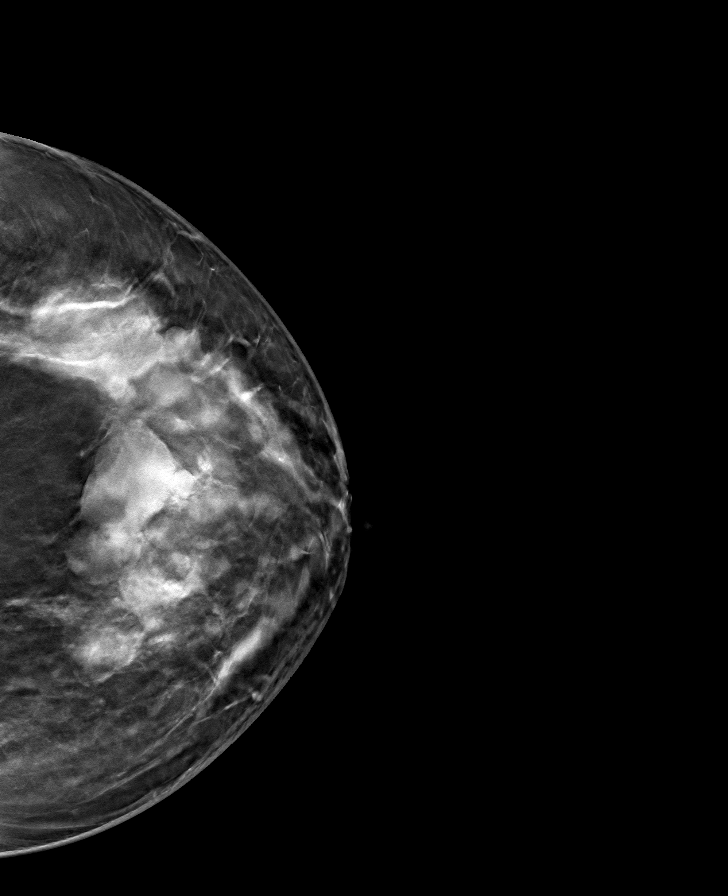

[R MLO tomo · tomo slice 47/93.0]
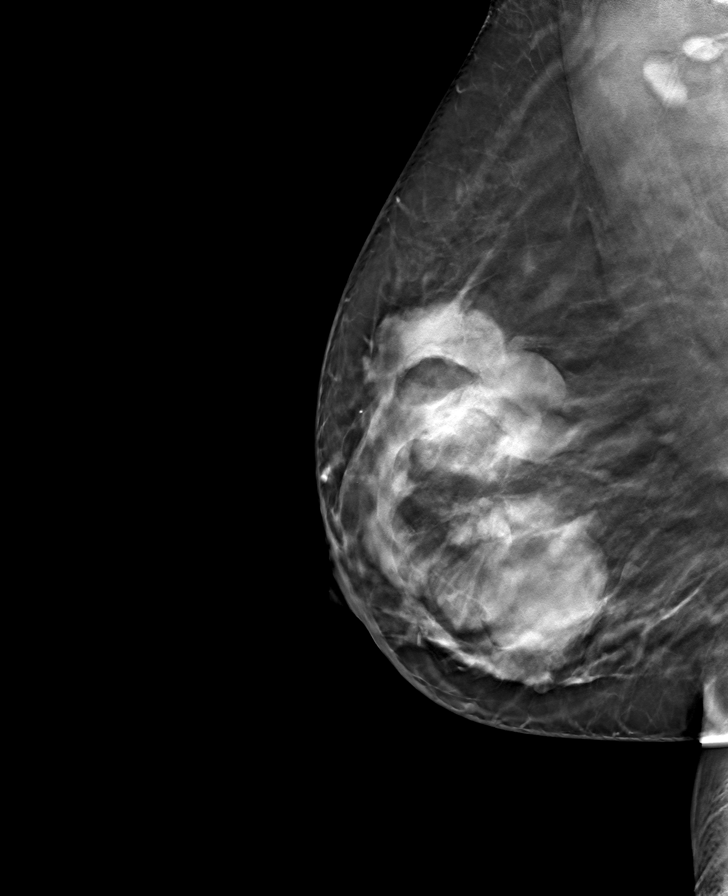

[R CC tomo · tomo slice 43/84.0]
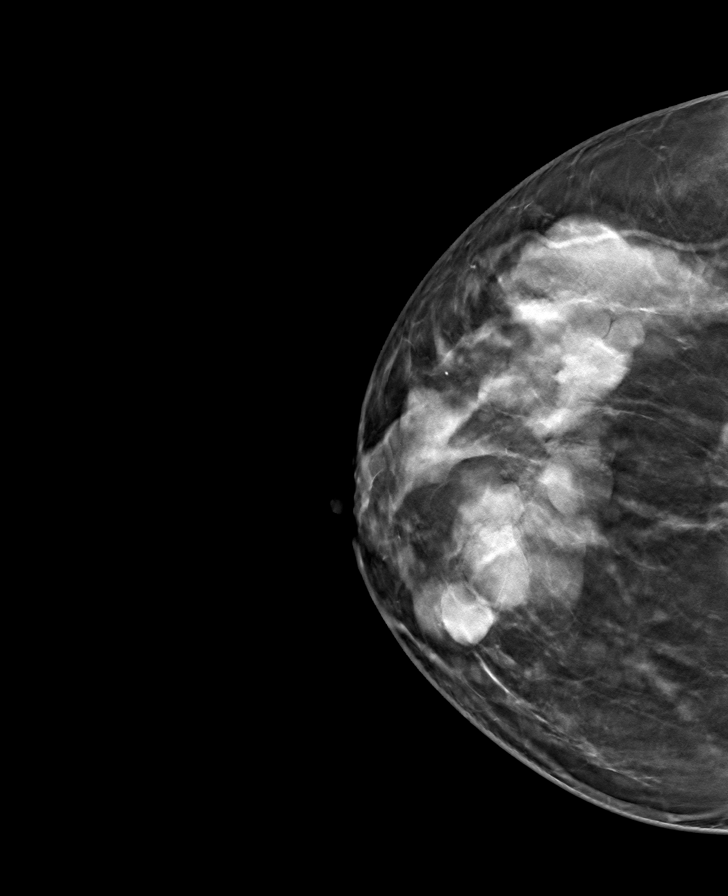

[8 of 24 positions shown; findings below may reference images not displayed]

ACR Breast Density Category c: The breast tissue is heterogeneously
dense, which may obscure small masses.
FINDINGS: There are no findings suspicious for malignancy.
IMPRESSION: No mammographic evidence of malignancy. A result letter of this
screening mammogram will be mailed directly to the patient.

RECOMMENDATION:
Screening mammogram in one year. (Code:Q3-W-BC3)

BI-RADS CATEGORY  1: Negative.

## 2024-06-24 ENCOUNTER — Other Ambulatory Visit: Payer: Self-pay | Admitting: Obstetrics and Gynecology

## 2024-06-24 DIAGNOSIS — Z9189 Other specified personal risk factors, not elsewhere classified: Secondary | ICD-10-CM

## 2024-07-10 ENCOUNTER — Ambulatory Visit (INDEPENDENT_AMBULATORY_CARE_PROVIDER_SITE_OTHER): Payer: Commercial Managed Care - PPO | Admitting: Family Medicine

## 2024-07-10 ENCOUNTER — Encounter: Payer: Self-pay | Admitting: Family Medicine

## 2024-07-10 VITALS — BP 128/86 | HR 76 | Temp 98.6°F | Ht 66.14 in | Wt 183.6 lb

## 2024-07-10 DIAGNOSIS — B079 Viral wart, unspecified: Secondary | ICD-10-CM

## 2024-07-10 DIAGNOSIS — Z0000 Encounter for general adult medical examination without abnormal findings: Secondary | ICD-10-CM

## 2024-07-10 DIAGNOSIS — L729 Follicular cyst of the skin and subcutaneous tissue, unspecified: Secondary | ICD-10-CM

## 2024-07-10 LAB — LIPID PANEL
Cholesterol: 228 mg/dL — ABNORMAL HIGH (ref 28–200)
HDL: 61.7 mg/dL
LDL Cholesterol: 141 mg/dL — ABNORMAL HIGH (ref 10–99)
NonHDL: 166.36
Total CHOL/HDL Ratio: 4
Triglycerides: 127 mg/dL (ref 10.0–149.0)
VLDL: 25.4 mg/dL (ref 0.0–40.0)

## 2024-07-10 LAB — CBC WITH DIFFERENTIAL/PLATELET
Basophils Absolute: 0.1 K/uL (ref 0.0–0.1)
Basophils Relative: 1.1 % (ref 0.0–3.0)
Eosinophils Absolute: 0.1 K/uL (ref 0.0–0.7)
Eosinophils Relative: 1.8 % (ref 0.0–5.0)
HCT: 38.3 % (ref 36.0–46.0)
Hemoglobin: 12.7 g/dL (ref 12.0–15.0)
Lymphocytes Relative: 28.7 % (ref 12.0–46.0)
Lymphs Abs: 2.2 K/uL (ref 0.7–4.0)
MCHC: 33.1 g/dL (ref 30.0–36.0)
MCV: 81.4 fl (ref 78.0–100.0)
Monocytes Absolute: 0.5 K/uL (ref 0.1–1.0)
Monocytes Relative: 6 % (ref 3.0–12.0)
Neutro Abs: 4.8 K/uL (ref 1.4–7.7)
Neutrophils Relative %: 62.4 % (ref 43.0–77.0)
Platelets: 385 K/uL (ref 150.0–400.0)
RBC: 4.71 Mil/uL (ref 3.87–5.11)
RDW: 14.4 % (ref 11.5–15.5)
WBC: 7.7 K/uL (ref 4.0–10.5)

## 2024-07-10 LAB — COMPREHENSIVE METABOLIC PANEL WITH GFR
ALT: 22 U/L (ref 3–35)
AST: 21 U/L (ref 5–37)
Albumin: 4.6 g/dL (ref 3.5–5.2)
Alkaline Phosphatase: 51 U/L (ref 39–117)
BUN: 9 mg/dL (ref 6–23)
CO2: 29 meq/L (ref 19–32)
Calcium: 9.7 mg/dL (ref 8.4–10.5)
Chloride: 101 meq/L (ref 96–112)
Creatinine, Ser: 0.73 mg/dL (ref 0.40–1.20)
GFR: 97.95 mL/min
Glucose, Bld: 86 mg/dL (ref 70–99)
Potassium: 3.9 meq/L (ref 3.5–5.1)
Sodium: 138 meq/L (ref 135–145)
Total Bilirubin: 0.3 mg/dL (ref 0.2–1.2)
Total Protein: 7.7 g/dL (ref 6.0–8.3)

## 2024-07-10 LAB — T4, FREE: Free T4: 0.95 ng/dL (ref 0.60–1.60)

## 2024-07-10 LAB — HEMOGLOBIN A1C: Hgb A1c MFr Bld: 5.6 % (ref 4.6–6.5)

## 2024-07-10 LAB — TSH: TSH: 1.32 u[IU]/mL (ref 0.35–5.50)

## 2024-07-10 NOTE — Progress Notes (Signed)
 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 MRN: [id number]  Chief Complaint  Patient presents with   Annual Exam    Pt is a 48 yo female seen for CPE.  Pt states she is doing well overall.  Had breast bxp in Sept which was benign.  Breast MRI scheduled for Jan 2026.  Pt has pap next wk with OB/Gyn.  Has an area of dry skin on L 3rd digit.  Also with a thin raised area on left neck x 2 months, non painful, pruritic or erythematous.  Pt is L handed.   Will be traveling with her daughter who is auditioning for various performance arts schools in the next few months.    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   Past Medical History:  Diagnosis Date   Anemia    GERD (gastroesophageal reflux disease)    Headache(784.0)    Heartburn in pregnancy    History of kidney stones    Hyperlipidemia    Microscopic hematuria    Migraines    Shortness of breath    with pregnancy wt gain   UTI (lower urinary tract infection)    Past Surgical History:  Procedure Laterality Date   BREAST BIOPSY Right 2018   BREAST BIOPSY Left 2021   BREAST BIOPSY Right 01/16/2024   US  RT BREAST BX W LOC DEV EA ADD LESION IMG BX SPEC US  GUIDE 01/16/2024 [facility]   BREAST BIOPSY Right 01/16/2024   US  RT BREAST BX W LOC DEV 1ST LESION IMG BX SPEC US  GUIDE 01/16/2024 [facility]   BREAST EXCISIONAL BIOPSY Left    CESAREAN SECTION  2008   CESAREAN SECTION N/A 12/10/2012   Procedure: REPEAT CESAREAN SECTION;  Surgeon: Duwaine Blumenthal, DO;  Location: [facility];  Service: Obstetrics;  Laterality: N/A;  Repeat edc 12/17/12   OVARIAN CYST REMOVAL     SALPINGOOPHORECTOMY  2000    Social History[1] Family History  Problem Relation Age of Onset   Cancer Sister        breast   Breast cancer Sister 76   Arthritis Maternal Grandmother    Hyperlipidemia Maternal Grandmother    Heart disease Maternal Grandmother    Alcohol abuse Maternal Grandfather    Arthritis Maternal Grandfather    Hyperlipidemia Maternal Grandfather    Heart disease Maternal Grandfather    Kidney disease Maternal Grandfather    Arthritis Paternal Grandmother    Arthritis Paternal Grandfather    Hyperlipidemia Paternal Grandfather    Hypertension Mother    Hypertension Father    Heart disease Father    Allergies[2]  ROS Negative unless stated above    Objective:     BP 128/86 (BP Location: Left Arm, Patient Position: Sitting, Cuff Size: Large)   Pulse 76   Temp 98.6 F (37 C) (Oral)   Ht 5' 6.14 (1.68 m)   Wt 183 lb 9.6 oz (83.3 kg)   LMP 06/28/2024 (Approximate)   SpO2 98%   BMI 29.51 kg/m  BP Readings from Last 3 Encounters:  07/10/24 128/86  07/05/23 110/72  06/29/22 110/84   Wt Readings from Last 3 Encounters:  07/10/24 183 lb 9.6  oz (83.3 kg)  07/05/23 179 lb 3.2 oz (81.3 kg)  06/29/22 176 lb 6.4 oz (80 kg)      Physical Exam Constitutional:      Appearance: Normal appearance.  HENT:     Head: Normocephalic and atraumatic.     Right Ear: Tympanic membrane, ear canal and external ear normal.     Left Ear: Tympanic membrane, ear canal and external ear normal.     Nose: Nose normal.     Mouth/Throat:     Mouth: Mucous membranes are moist.     Pharynx: No oropharyngeal exudate or posterior oropharyngeal erythema.  Eyes:     General: No scleral icterus.    Extraocular Movements: Extraocular movements intact.     Conjunctiva/sclera: Conjunctivae normal.     Pupils: Pupils are equal, round, and reactive to light.  Neck:     Thyroid: No thyromegaly.     Vascular: No carotid bruit.  Cardiovascular:     Rate and Rhythm: Normal rate and regular rhythm.      Pulses: Normal pulses.     Heart sounds: Normal heart sounds. No murmur heard.    No friction rub.  Pulmonary:     Effort: Pulmonary effort is normal.     Breath sounds: Normal breath sounds. No wheezing, rhonchi or rales.  Abdominal:     General: Bowel sounds are normal.     Palpations: Abdomen is soft.     Tenderness: There is no abdominal tenderness.  Musculoskeletal:        General: No deformity. Normal range of motion.  Lymphadenopathy:     Cervical: No cervical adenopathy.  Skin:    General: Skin is warm and dry.     Findings: No lesion.     Comments: L 3rd digit with 4 slightly raised dry wart like lesions.  One appears more papular and smooth.  2 pits in fingernail of L 3rd digit. L lateral low neck with a 6 mm oblong thin cyst like lesion.  No erythema, tenderness, or drainage.  Neurological:     General: No focal deficit present.     Mental Status: She is alert and oriented to person, place, and time.  Psychiatric:        Mood and Affect: Mood normal.        Thought Content: Thought content normal.        07/10/2024    9:16 AM 07/05/2023    9:55 AM 06/29/2022    1:15 PM  Depression screen PHQ 2/9  Decreased Interest 0 0 0  Down, Depressed, Hopeless 0 0 0  PHQ - 2 Score 0 0 0  Altered sleeping 0 0 0  Tired, decreased energy 1 1 0  Change in appetite 0  0  Feeling bad or failure about yourself  0 0 0  Trouble concentrating 0 0 0  Moving slowly or fidgety/restless 0 0 0  Suicidal thoughts 0  0  PHQ-9 Score 1 1  0   Difficult doing work/chores Not difficult at all Not difficult at all Not difficult at all     Data saved with a previous flowsheet row definition      07/10/2024    9:16 AM 07/05/2023    9:55 AM  GAD 7 : Generalized Anxiety Score  Nervous, Anxious, on Edge 0 1  Control/stop worrying 0 0  Worry too much - different things 0 0  Trouble relaxing 0 0  Restless 0 0  Easily annoyed or irritable  0 0  Afraid - awful might happen 0 0  Total GAD 7  Score 0 1  Anxiety Difficulty Not difficult at all      No results found for any visits on 07/10/24.    Assessment & Plan:   Well adult exam -     CBC with Differential/Platelet; Future -     Comprehensive metabolic panel with GFR; Future -     Hemoglobin A1c; Future -     Lipid panel; Future -     T4, free; Future -     TSH; Future  Viral wart on finger  Cyst of skin   Age-appropriate health screenings discussed.  Obtain labs.  Immunizations reviewed.  Pap with OB/GYN next wk.  Pt to schedule eye exam in mid 2026.  Cologuard done 08/16/2023 and normal.  Repeat in 2028.  Consent obtained.  Cryotherapy for lesions on left finger. Advised may need additional treatment.  Monitor cyst.  Discussed removal options.  Return if symptoms worsen or fail to improve, for next CPE in 1 yr.   Clotilda JONELLE Single, MD    [1]  Social History Tobacco Use   Smoking status: Never  Substance Use Topics   Alcohol use: Yes    Comment: Drink 1-3 alcoholic beverages per year   Drug use: Never  [2] No Known Allergies  "

## 2024-07-31 ENCOUNTER — Ambulatory Visit
Admission: RE | Admit: 2024-07-31 | Discharge: 2024-07-31 | Disposition: A | Source: Ambulatory Visit | Attending: Obstetrics and Gynecology | Admitting: Obstetrics and Gynecology

## 2024-07-31 DIAGNOSIS — Z9189 Other specified personal risk factors, not elsewhere classified: Secondary | ICD-10-CM

## 2024-07-31 MED ORDER — GADOPICLENOL 0.5 MMOL/ML IV SOLN
10.0000 mL | Freq: Once | INTRAVENOUS | Status: AC | PRN
  Administered 2024-07-31: 10 mL via INTRAVENOUS

## 2024-08-02 ENCOUNTER — Ambulatory Visit: Payer: Self-pay | Admitting: Family Medicine

## 2024-08-02 DIAGNOSIS — E782 Mixed hyperlipidemia: Secondary | ICD-10-CM | POA: Insufficient documentation

## 2025-07-12 ENCOUNTER — Encounter: Admitting: Family Medicine
